# Patient Record
Sex: Male | Born: 1945 | Race: White | Hispanic: No | Marital: Married | Smoking: Former smoker
Health system: Southern US, Community
[De-identification: ages and names within clinical notes are randomized; demographics above are authoritative.]

## PROBLEM LIST (undated history)

## (undated) DIAGNOSIS — K259 Gastric ulcer, unspecified as acute or chronic, without hemorrhage or perforation: Secondary | ICD-10-CM

## (undated) DIAGNOSIS — E291 Testicular hypofunction: Secondary | ICD-10-CM

## (undated) DIAGNOSIS — R251 Tremor, unspecified: Secondary | ICD-10-CM

## (undated) DIAGNOSIS — Z9189 Other specified personal risk factors, not elsewhere classified: Secondary | ICD-10-CM

## (undated) DIAGNOSIS — N400 Enlarged prostate without lower urinary tract symptoms: Secondary | ICD-10-CM

## (undated) DIAGNOSIS — R062 Wheezing: Secondary | ICD-10-CM

## (undated) DIAGNOSIS — I10 Essential (primary) hypertension: Secondary | ICD-10-CM

## (undated) DIAGNOSIS — G4710 Hypersomnia, unspecified: Secondary | ICD-10-CM

## (undated) DIAGNOSIS — M76899 Other specified enthesopathies of unspecified lower limb, excluding foot: Secondary | ICD-10-CM

## (undated) DIAGNOSIS — M1990 Unspecified osteoarthritis, unspecified site: Secondary | ICD-10-CM

## (undated) DIAGNOSIS — E7800 Pure hypercholesterolemia, unspecified: Secondary | ICD-10-CM

## (undated) DIAGNOSIS — K219 Gastro-esophageal reflux disease without esophagitis: Secondary | ICD-10-CM

## (undated) DIAGNOSIS — R600 Localized edema: Secondary | ICD-10-CM

## (undated) DIAGNOSIS — B351 Tinea unguium: Secondary | ICD-10-CM

## (undated) DIAGNOSIS — D649 Anemia, unspecified: Secondary | ICD-10-CM

## (undated) DIAGNOSIS — G4730 Sleep apnea, unspecified: Secondary | ICD-10-CM

## (undated) DIAGNOSIS — Z860100 Personal history of colon polyps, unspecified: Secondary | ICD-10-CM

## (undated) DIAGNOSIS — J189 Pneumonia, unspecified organism: Secondary | ICD-10-CM

## (undated) DIAGNOSIS — Z872 Personal history of diseases of the skin and subcutaneous tissue: Secondary | ICD-10-CM

## (undated) DIAGNOSIS — R0600 Dyspnea, unspecified: Secondary | ICD-10-CM

## (undated) DIAGNOSIS — L409 Psoriasis, unspecified: Secondary | ICD-10-CM

## (undated) DIAGNOSIS — C801 Malignant (primary) neoplasm, unspecified: Secondary | ICD-10-CM

## (undated) DIAGNOSIS — M549 Dorsalgia, unspecified: Secondary | ICD-10-CM

## (undated) DIAGNOSIS — J45909 Unspecified asthma, uncomplicated: Secondary | ICD-10-CM

## (undated) DIAGNOSIS — Z889 Allergy status to unspecified drugs, medicaments and biological substances status: Secondary | ICD-10-CM

## (undated) HISTORY — PX: EYE SURGERY: SHX253

## (undated) HISTORY — PX: OTHER SURGICAL HISTORY: SHX169

## (undated) HISTORY — PX: TONSILLECTOMY: SUR1361

---

## 2008-04-09 ENCOUNTER — Ambulatory Visit: Payer: Self-pay | Admitting: Specialist

## 2008-04-11 ENCOUNTER — Ambulatory Visit: Payer: Self-pay | Admitting: Unknown Physician Specialty

## 2013-08-31 ENCOUNTER — Ambulatory Visit: Payer: Self-pay | Admitting: Unknown Physician Specialty

## 2013-12-15 ENCOUNTER — Inpatient Hospital Stay: Payer: Self-pay | Admitting: Internal Medicine

## 2013-12-15 LAB — COMPREHENSIVE METABOLIC PANEL
ALBUMIN: 3.3 g/dL — AB (ref 3.4–5.0)
Alkaline Phosphatase: 96 U/L
Anion Gap: 3 — ABNORMAL LOW (ref 7–16)
BUN: 21 mg/dL — ABNORMAL HIGH (ref 7–18)
Bilirubin,Total: 0.5 mg/dL (ref 0.2–1.0)
CHLORIDE: 104 mmol/L (ref 98–107)
CREATININE: 1.11 mg/dL (ref 0.60–1.30)
Calcium, Total: 9.1 mg/dL (ref 8.5–10.1)
Co2: 27 mmol/L (ref 21–32)
Glucose: 127 mg/dL — ABNORMAL HIGH (ref 65–99)
Osmolality: 273 (ref 275–301)
POTASSIUM: 4.4 mmol/L (ref 3.5–5.1)
SGOT(AST): 30 U/L (ref 15–37)
SGPT (ALT): 34 U/L (ref 12–78)
SODIUM: 134 mmol/L — AB (ref 136–145)
TOTAL PROTEIN: 8.1 g/dL (ref 6.4–8.2)

## 2013-12-15 LAB — CBC WITH DIFFERENTIAL/PLATELET
Basophil #: 0.1 10*3/uL (ref 0.0–0.1)
Basophil %: 0.8 %
EOS ABS: 0.2 10*3/uL (ref 0.0–0.7)
Eosinophil %: 2 %
HCT: 38.4 % — ABNORMAL LOW (ref 40.0–52.0)
HGB: 13.2 g/dL (ref 13.0–18.0)
LYMPHS PCT: 21 %
Lymphocyte #: 2.3 10*3/uL (ref 1.0–3.6)
MCH: 31.3 pg (ref 26.0–34.0)
MCHC: 34.3 g/dL (ref 32.0–36.0)
MCV: 91 fL (ref 80–100)
MONOS PCT: 8.1 %
Monocyte #: 0.9 x10 3/mm (ref 0.2–1.0)
Neutrophil #: 7.6 10*3/uL — ABNORMAL HIGH (ref 1.4–6.5)
Neutrophil %: 68.1 %
PLATELETS: 195 10*3/uL (ref 150–440)
RBC: 4.21 10*6/uL — ABNORMAL LOW (ref 4.40–5.90)
RDW: 14.6 % — ABNORMAL HIGH (ref 11.5–14.5)
WBC: 11.2 10*3/uL — AB (ref 3.8–10.6)

## 2013-12-15 LAB — HEMOGLOBIN A1C: Hemoglobin A1C: 6.1 % (ref 4.2–6.3)

## 2013-12-16 LAB — CBC WITH DIFFERENTIAL/PLATELET
Basophil #: 0.1 10*3/uL (ref 0.0–0.1)
Basophil %: 0.7 %
EOS ABS: 0.3 10*3/uL (ref 0.0–0.7)
EOS PCT: 3.3 %
HCT: 36.4 % — AB (ref 40.0–52.0)
HGB: 12.2 g/dL — AB (ref 13.0–18.0)
Lymphocyte #: 1.7 10*3/uL (ref 1.0–3.6)
Lymphocyte %: 21.8 %
MCH: 30.8 pg (ref 26.0–34.0)
MCHC: 33.6 g/dL (ref 32.0–36.0)
MCV: 92 fL (ref 80–100)
Monocyte #: 0.9 x10 3/mm (ref 0.2–1.0)
Monocyte %: 10.9 %
NEUTROS ABS: 5 10*3/uL (ref 1.4–6.5)
Neutrophil %: 63.3 %
PLATELETS: 194 10*3/uL (ref 150–440)
RBC: 3.98 10*6/uL — ABNORMAL LOW (ref 4.40–5.90)
RDW: 14.5 % (ref 11.5–14.5)
WBC: 8 10*3/uL (ref 3.8–10.6)

## 2013-12-16 LAB — BASIC METABOLIC PANEL
ANION GAP: 4 — AB (ref 7–16)
BUN: 19 mg/dL — AB (ref 7–18)
CO2: 27 mmol/L (ref 21–32)
CREATININE: 1.22 mg/dL (ref 0.60–1.30)
Calcium, Total: 8.6 mg/dL (ref 8.5–10.1)
Chloride: 105 mmol/L (ref 98–107)
EGFR (African American): >60
Glucose: 107 mg/dL — ABNORMAL HIGH (ref 65–99)
OSMOLALITY: 275 (ref 275–301)
Potassium: 4 mmol/L (ref 3.5–5.1)
Sodium: 136 mmol/L (ref 136–145)

## 2013-12-17 LAB — VANCOMYCIN, TROUGH: Vancomycin, Trough: 6 ug/mL — ABNORMAL LOW (ref 10–20)

## 2013-12-18 LAB — CREATININE, SERUM
Creatinine: 1.2 mg/dL (ref 0.60–1.30)
EGFR (African American): >60

## 2013-12-18 LAB — WBC: WBC: 7.4 10*3/uL (ref 3.8–10.6)

## 2013-12-20 LAB — CULTURE, BLOOD (SINGLE)

## 2014-04-30 ENCOUNTER — Ambulatory Visit: Payer: Self-pay | Admitting: Family Medicine

## 2014-07-13 ENCOUNTER — Ambulatory Visit: Payer: Self-pay | Admitting: Ophthalmology

## 2014-07-13 LAB — SEDIMENTATION RATE: ERYTHROCYTE SED RATE: 15 mm/h (ref 0–20)

## 2014-07-17 ENCOUNTER — Ambulatory Visit: Payer: Self-pay | Admitting: Ophthalmology

## 2014-07-20 ENCOUNTER — Ambulatory Visit: Payer: Self-pay | Admitting: Ophthalmology

## 2014-07-20 LAB — CREATININE, SERUM
Creatinine: 1.18 mg/dL (ref 0.60–1.30)
EGFR (African American): >60
EGFR (Non-African Amer.): >60

## 2015-02-16 NOTE — Discharge Summary (Signed)
PATIENT NAME:  Zachary Chase, Zachary Chase MR#:  [id number] DATE OF BIRTH:  1945/11/13  DATE OF ADMISSION:  12/15/2013 DATE OF DISCHARGE:  12/18/2013  DISCHARGE DIAGNOSES: 1.  Right hand cellulitis. 2.  Benign prostatic hyperplasia.  DISCHARGE MEDICATIONS: 1.  Amoxicillin 500 mg p.o. t.i.d. 2.  Doxycycline 100 mg p.o. b.i.d. 3.  Flomax 0.4 mg p.o. b.i.d. 4.  (finasteride  1 tablet p.o. daily.  The patient was given prescriptions for amoxicillin and also doxycycline for 10 days.   CONSULTATIONS: None.   HOSPITAL COURSE: The patient is a 69 year old male with history of BPH came in because of severe right hand swelling, erythema and tenderness. The patient woke up from sleep with swelling and redness and tenderness of the right hand, admitted for right hand cellulitis, did not have any history of trauma but right hand skin is very dry and has areas of skin irritation and likely open skin spots due to cracked skin with winter.  The patient's white count on admission was 11.2.  The patient received ultrasound of the upper extremity on the right which did not show any DVT.  He started he was started on IV Vanco and Zosyn. Blood cultures were negative. The patient's pain really improved and also redness and swelling improved with IV antibiotics alone, and the patient's white count also dropped to 8 and electrolytes did stay stable.  Blood cultures were negative. The patient's swelling was improved and he was able to move the fingers without pain and his redness also resolved. I advised him to keep the skin very moist to avoid cracks and also infection, and discharged home with amoxicillin and also doxycycline. The patient can follow up with his primary doctor in a week.   DISCHARGE VITAL SIGNS:  Heart rate 86, blood pressure 129/81 and sats are 95% on room air.  TIME SPENT ON DISCHARGE PREPARATION:  More than 30 minutes.  ____________________________ Epifanio Lesches, MD sk:ce D: 12/20/2013 11:53:04  ET T: 12/20/2013 12:29:16 ET JOB#: [id number]  cc: Epifanio Lesches, MD, <Dictator> Epifanio Lesches MD ELECTRONICALLY SIGNED 12/21/2013 14:21

## 2015-02-16 NOTE — H&P (Signed)
PATIENT NAME:  Zachary Chase, Zachary Chase MR#:  [id number] DATE OF BIRTH:  1946/07/09  DATE OF ADMISSION:  12/15/2013  PRIMARY CARE PROVIDER: Dr. Lovie Macadamia.  EMERGENCY DEPARTMENT REFERRING PHYSICIAN: Corinna Capra, MD  CHIEF COMPLAINT: Right hand swelling, erythema.   HISTORY OF PRESENT ILLNESS: The patient is a pleasant 69 year old white male with history of BPH, asthma, seasonal allergies, who reports that he was doing well up until about 2 days ago when he was sleeping, and when he woke up, his whole right hand was swollen and erythematous. It started in the wrist. Now the rest of his hand is swollen as well. He is not having much pain. He did have some pain in the wrist. He does not recall being bit by any insects or any trauma to that hand.   He otherwise denies any fever, but the hand, he reports, has being feeling hot. He denies any chest pain, shortness of breath, nausea, vomiting, diarrhea, or any urinary symptoms.   PAST MEDICAL HISTORY:  1.  History of BPH.  2.  History of asthma.  3.  History of seasonal allergies.   PAST SURGICAL HISTORY: HAD ) ALLERGY FOR SLEEP APNEA.   ALLERGIES: SULFA.   CURRENT MEDICATIONS: He is on Flomax 0.4, one tab p.o. daily; finasteride, he is not sure of the dose.   SOCIAL HISTORY: History of smoking, quit. Drinks 1 or 2 glasses of wine on a daily basis. No drug use.   FAMILY HISTORY: Positive for hypertension.   REVIEW OF SYSTEMS:  CONSTITUTIONAL: Denies any fevers, fatigue, weakness, some pain in the hand. No weight loss. No weight gain.  EYES: No blurred or double vision. No pain. No redness. No inflammation. No glaucoma. No cataracts.  ENT: No tinnitus. No ear pain. No hearing loss. No seasonal or year-round allergies. No epistaxis. No nasal discharge. No difficulty swallowing.  RESPIRATORY: Denies any cough, wheezing, hemoptysis. No dyspnea.  CARDIOVASCULAR: Denies any chest pain, orthopnea, edema.  GASTROINTESTINAL: No nausea, vomiting, diarrhea.  No abdominal pain. No hematemesis. No melena. No ulcer.  GENITOURINARY: Denies any dysuria, hematuria, renal calculus or frequency.  ENDOCRINE: Denies any polyuria, nocturia or thyroid problems.  HEMOLYMPHATIC: Denies any major bruisability or bleeding.  SKIN: No acne. No rash. No changes in mole, hair or skin.  MUSCULOSKELETAL: Complains of some pain in the right hand.  NEUROLOGIC: No numbness, CVA, TIA or seizures.  PSYCHIATRIC: No anxiety. No insomnia.   PHYSICAL EXAMINATION:  VITAL SIGNS: Temperature 98.3, pulse 93, respirations 18, blood pressure 139/78, O2 94%.  GENERAL: A well-developed male in no acute distress.  HEENT: Head atraumatic, normocephalic. Pupils equally round, reactive to light and accommodation. There is no conjunctival pallor. No scleral icterus. Nasal exam shows no drainage or ulceration.  OROPHARYNX: Clear without any exudate.  NECK: Supple without any JVD.  CARDIOVASCULAR: Regular rate and rhythm. No murmurs, rubs, clicks, or gallops. PMI is not displaced.  LUNGS: Clear to auscultation bilaterally without any rales, rhonchi, wheezing.  ABDOMEN: Soft, nontender, nondistended. Positive bowel sounds x4.  EXTREMITIES: Right hand swelling. There is erythema on the wrist as well as the hand. He is able to flex, extend, and bend his fingers without any difficulties. There is erythema and warmth extending up midway to his hand.  NEUROLOGIC: Awake, alert, oriented x3. No focal deficits.  PSYCHIATRIC: Not anxious or depressed.  LYMPHATICS: No lymph nodes palpable.   LABORATORY DATA: Glucose 127, BUN 21, creatinine 1.11, sodium 134, potassium 4.4, chloride 104. CO2 is 27. Calcium  is 9.1. LFTs: Total protein 8.1, albumin 3.3, bilirubin total 0.5, alk phos 96, AST 30. ALT is 34. WBC 11.2, hemoglobin 13.2, platelet count is 195.   Ultrasound Doppler of the upper extremity negative for DVT.   ASSESSMENT AND PLAN: The patient is a pleasant 69 year old white male who presents with  right hand swelling and erythema.  1.  Severe right-hand cellulitis. At this time, I will treat him with IV vancomycin and Zosyn due to the severity of the infection. We will also obtain blood cultures. Follow for any worsening symptoms. At this point, he is stable to bend his fingers. There is no evidence of compartment syndrome, but if he starts having significant pain or worsening swelling, then we will have to obtain CT or MRI to rule these out.  2.  Benign prostatic hypertrophy. We will continue Flomax and finasteride once the finasteride dose is verified.  3.  History of asthma, stable. Currently not on any treatment.  4.  MISCELLANEOUS: We will place him on heparin for deep vein thrombosis prophylaxis.    NOTE: Time spent 50 minutes.    ____________________________ Lafonda Mosses Posey Pronto, MD shp:np D: 12/15/2013 20:39:19 ET T: 12/15/2013 20:55:11 ET JOB#: [id number]  cc: Amilah Greenspan H. Posey Pronto, MD, <Dictator> Alric Seton MD ELECTRONICALLY SIGNED 12/29/2013 17:57 Alric Seton MD ELECTRONICALLY SIGNED 12/29/2013 17:57

## 2016-08-27 ENCOUNTER — Encounter: Payer: Self-pay | Admitting: *Deleted

## 2016-09-01 ENCOUNTER — Ambulatory Visit: Payer: Medicare Other | Admitting: Anesthesiology

## 2016-09-01 ENCOUNTER — Encounter
Admission: RE | Disposition: A | Discharge status: Home / Self Care | Payer: Self-pay | Source: Ambulatory Visit | Attending: Ophthalmology

## 2016-09-01 ENCOUNTER — Ambulatory Visit
Admission: RE | Admit: 2016-09-01 | Discharge: 2016-09-01 | Disposition: A | Discharge status: Home / Self Care | Payer: Medicare Other | Source: Ambulatory Visit | Attending: Ophthalmology | Admitting: Ophthalmology

## 2016-09-01 DIAGNOSIS — Z87891 Personal history of nicotine dependence: Secondary | ICD-10-CM | POA: Diagnosis not present

## 2016-09-01 DIAGNOSIS — N400 Enlarged prostate without lower urinary tract symptoms: Secondary | ICD-10-CM | POA: Insufficient documentation

## 2016-09-01 DIAGNOSIS — Z79899 Other long term (current) drug therapy: Secondary | ICD-10-CM | POA: Insufficient documentation

## 2016-09-01 DIAGNOSIS — G8929 Other chronic pain: Secondary | ICD-10-CM | POA: Insufficient documentation

## 2016-09-01 DIAGNOSIS — H2511 Age-related nuclear cataract, right eye: Secondary | ICD-10-CM | POA: Diagnosis not present

## 2016-09-01 DIAGNOSIS — Z791 Long term (current) use of non-steroidal anti-inflammatories (NSAID): Secondary | ICD-10-CM | POA: Insufficient documentation

## 2016-09-01 DIAGNOSIS — I10 Essential (primary) hypertension: Secondary | ICD-10-CM | POA: Diagnosis not present

## 2016-09-01 DIAGNOSIS — Z9889 Other specified postprocedural states: Secondary | ICD-10-CM | POA: Diagnosis not present

## 2016-09-01 DIAGNOSIS — R600 Localized edema: Secondary | ICD-10-CM | POA: Insufficient documentation

## 2016-09-01 DIAGNOSIS — M1990 Unspecified osteoarthritis, unspecified site: Secondary | ICD-10-CM | POA: Diagnosis not present

## 2016-09-01 DIAGNOSIS — E669 Obesity, unspecified: Secondary | ICD-10-CM | POA: Insufficient documentation

## 2016-09-01 DIAGNOSIS — M542 Cervicalgia: Secondary | ICD-10-CM | POA: Diagnosis not present

## 2016-09-01 DIAGNOSIS — J45909 Unspecified asthma, uncomplicated: Secondary | ICD-10-CM | POA: Insufficient documentation

## 2016-09-01 DIAGNOSIS — G4730 Sleep apnea, unspecified: Secondary | ICD-10-CM | POA: Insufficient documentation

## 2016-09-01 DIAGNOSIS — Z7951 Long term (current) use of inhaled steroids: Secondary | ICD-10-CM | POA: Diagnosis not present

## 2016-09-01 DIAGNOSIS — Z6828 Body mass index (BMI) 28.0-28.9, adult: Secondary | ICD-10-CM | POA: Insufficient documentation

## 2016-09-01 DIAGNOSIS — Z8711 Personal history of peptic ulcer disease: Secondary | ICD-10-CM | POA: Diagnosis not present

## 2016-09-01 HISTORY — DX: Gastric ulcer, unspecified as acute or chronic, without hemorrhage or perforation: K25.9

## 2016-09-01 HISTORY — DX: Tremor, unspecified: R25.1

## 2016-09-01 HISTORY — DX: Essential (primary) hypertension: I10

## 2016-09-01 HISTORY — DX: Dorsalgia, unspecified: M54.9

## 2016-09-01 HISTORY — DX: Unspecified asthma, uncomplicated: J45.909

## 2016-09-01 HISTORY — PX: CATARACT EXTRACTION W/PHACO: SHX586

## 2016-09-01 HISTORY — DX: Other specified personal risk factors, not elsewhere classified: Z91.89

## 2016-09-01 HISTORY — DX: Wheezing: R06.2

## 2016-09-01 HISTORY — DX: Allergy status to unspecified drugs, medicaments and biological substances: Z88.9

## 2016-09-01 HISTORY — DX: Pneumonia, unspecified organism: J18.9

## 2016-09-01 HISTORY — DX: Localized edema: R60.0

## 2016-09-01 HISTORY — DX: Benign prostatic hyperplasia without lower urinary tract symptoms: N40.0

## 2016-09-01 SURGERY — PHACOEMULSIFICATION, CATARACT, WITH IOL INSERTION
Anesthesia: Monitor Anesthesia Care | Site: Eye | Laterality: Right | Wound class: Clean

## 2016-09-01 MED ORDER — MOXIFLOXACIN HCL 0.5 % OP SOLN
OPHTHALMIC | Status: DC | PRN
  Administered 2016-09-01: 9 [drp] via OPHTHALMIC

## 2016-09-01 MED ORDER — NA CHONDROIT SULF-NA HYALURON 40-17 MG/ML IO SOLN
INTRAOCULAR | Status: DC | PRN
  Administered 2016-09-01: 1 mL via INTRAOCULAR

## 2016-09-01 MED ORDER — ARMC OPHTHALMIC DILATING DROPS
OPHTHALMIC | Status: AC
  Administered 2016-09-01: 1 via OPHTHALMIC
  Filled 2016-09-01: qty 0.4

## 2016-09-01 MED ORDER — SODIUM CHLORIDE 0.9 % IV SOLN
INTRAVENOUS | Status: DC
  Administered 2016-09-01: 10:00:00 via INTRAVENOUS

## 2016-09-01 MED ORDER — EPINEPHRINE PF 1 MG/ML IJ SOLN
INTRAOCULAR | Status: DC | PRN
  Administered 2016-09-01: 200 mL via OPHTHALMIC

## 2016-09-01 MED ORDER — ARMC OPHTHALMIC DILATING DROPS
1.0000 "application " | OPHTHALMIC | Status: AC
  Administered 2016-09-01 (×3): 1 via OPHTHALMIC

## 2016-09-01 MED ORDER — CARBACHOL 0.01 % IO SOLN
INTRAOCULAR | Status: DC | PRN
  Administered 2016-09-01: 0.5 mL via INTRAOCULAR

## 2016-09-01 MED ORDER — ARMC OPHTHALMIC DILATING DROPS: 1.0000 "application " | OPHTHALMIC | Status: AC

## 2016-09-01 MED ORDER — MIDAZOLAM HCL 2 MG/2ML IJ SOLN
INTRAMUSCULAR | Status: DC | PRN
  Administered 2016-09-01: 1 mg via INTRAVENOUS

## 2016-09-01 MED ORDER — EPINEPHRINE PF 1 MG/ML IJ SOLN
INTRAMUSCULAR | Status: AC
  Filled 2016-09-01: qty 2

## 2016-09-01 MED ORDER — POVIDONE-IODINE 5 % OP SOLN
OPHTHALMIC | Status: AC
  Filled 2016-09-01: qty 30

## 2016-09-01 MED ORDER — MOXIFLOXACIN HCL 0.5 % OP SOLN
1.0000 [drp] | OPHTHALMIC | Status: AC
  Administered 2016-09-01 (×3): 1 [drp] via OPHTHALMIC

## 2016-09-01 MED ORDER — LIDOCAINE HCL (PF) 4 % IJ SOLN
INTRAMUSCULAR | Status: AC
  Filled 2016-09-01: qty 5

## 2016-09-01 MED ORDER — NA CHONDROIT SULF-NA HYALURON 40-17 MG/ML IO SOLN
INTRAOCULAR | Status: AC
Start: 2016-09-01 — End: 2016-09-01
  Filled 2016-09-01: qty 1

## 2016-09-01 MED ORDER — MOXIFLOXACIN HCL 0.5 % OP SOLN: 1.0000 [drp] | OPHTHALMIC | Status: AC

## 2016-09-01 MED ORDER — MOXIFLOXACIN HCL 0.5 % OP SOLN
OPHTHALMIC | Status: AC
  Administered 2016-09-01: 1 [drp] via OPHTHALMIC
  Filled 2016-09-01: qty 3

## 2016-09-01 MED ORDER — FENTANYL CITRATE (PF) 100 MCG/2ML IJ SOLN
INTRAMUSCULAR | Status: DC | PRN
  Administered 2016-09-01: 50 ug via INTRAVENOUS

## 2016-09-01 MED ORDER — LIDOCAINE HCL (PF) 4 % IJ SOLN
INTRAOCULAR | Status: DC | PRN
  Administered 2016-09-01: 4 mL via OPHTHALMIC

## 2016-09-01 SURGICAL SUPPLY — 21 items
CANNULA ANT/CHMB 27GA (MISCELLANEOUS) ×3 IMPLANT
CUP MEDICINE 2OZ PLAST GRAD ST (MISCELLANEOUS) ×3 IMPLANT
GLOVE BIO SURGEON STRL SZ8 (GLOVE) ×3 IMPLANT
GLOVE BIOGEL M 6.5 STRL (GLOVE) ×3 IMPLANT
GLOVE SURG LX 8.0 MICRO (GLOVE) ×2
GLOVE SURG LX STRL 8.0 MICRO (GLOVE) ×1 IMPLANT
GOWN STRL REUS W/ TWL LRG LVL3 (GOWN DISPOSABLE) ×2 IMPLANT
GOWN STRL REUS W/TWL LRG LVL3 (GOWN DISPOSABLE) ×4
LENS IOL TECNIS ITEC 21.0 (Intraocular Lens) ×3 IMPLANT
PACK CATARACT (MISCELLANEOUS) ×3 IMPLANT
PACK CATARACT BRASINGTON LX (MISCELLANEOUS) ×3 IMPLANT
PACK EYE AFTER SURG (MISCELLANEOUS) ×3 IMPLANT
SOL BSS BAG (MISCELLANEOUS) ×3
SOL PREP PVP 2OZ (MISCELLANEOUS) ×3
SOLUTION BSS BAG (MISCELLANEOUS) ×1 IMPLANT
SOLUTION PREP PVP 2OZ (MISCELLANEOUS) ×1 IMPLANT
SYR 3ML LL SCALE MARK (SYRINGE) ×3 IMPLANT
SYR 5ML LL (SYRINGE) ×3 IMPLANT
SYR TB 1ML 27GX1/2 LL (SYRINGE) ×3 IMPLANT
WATER STERILE IRR 250ML POUR (IV SOLUTION) ×3 IMPLANT
WIPE NON LINTING 3.25X3.25 (MISCELLANEOUS) ×3 IMPLANT

## 2016-09-01 NOTE — Transfer of Care (Signed)
Immediate Anesthesia Transfer of Care Note  Patient: Zachary Chase  Procedure(s) Performed: Procedure(s) with comments: CATARACT EXTRACTION PHACO AND INTRAOCULAR LENS PLACEMENT (IOC) (Right) - Lot# [id number] H [place]: 00:34.0 AP%:21.8 CDE: 7.39   Patient Location: PACU  Anesthesia Type:MAC  Level of Consciousness: awake, alert  and oriented  Airway & Oxygen Therapy: Patient Spontanous Breathing  Post-op Assessment: Report given to RN and Post -op Vital signs reviewed and stable  Post vital signs: Reviewed and stable  Last Vitals:  Vitals:   09/01/16 1135 09/01/16 1139  BP: 120/62 120/62  Pulse: 73 73  Resp: 16 16  Temp: 36.3 C     Last Pain:  Vitals:   09/01/16 1139  TempSrc: Oral         Complications: No apparent anesthesia complications

## 2016-09-01 NOTE — Anesthesia Preprocedure Evaluation (Signed)
Anesthesia Evaluation  Patient identified by MRN, date of birth, ID band Patient awake    Reviewed: Allergy & Precautions, NPO status , Patient's Chart, lab work & pertinent test results  Airway Mallampati: II  TM Distance: >3 FB Neck ROM: Full    Dental no notable dental hx.    Pulmonary asthma (daily controller, well controlled) , former smoker,    breath sounds clear to auscultation- rhonchi (-) wheezing      Cardiovascular Exercise Tolerance: Good hypertension, Pt. on medications (-) CAD, (-) Past MI and (-) Cardiac Stents  Rhythm:Regular Rate:Normal - Systolic murmurs and - Diastolic murmurs    Neuro/Psych negative neurological ROS  negative psych ROS   GI/Hepatic Neg liver ROS, PUD,   Endo/Other  negative endocrine ROSneg diabetes  Renal/GU negative Renal ROS     Musculoskeletal negative musculoskeletal ROS (+)   Abdominal (+) + obese,   Peds  Hematology negative hematology ROS (+)   Anesthesia Other Findings Past Medical History: No date: Asthma No date: Back pain No date: BPH (benign prostatic hyperplasia) No date: Gastric ulcer No date: H/O multiple allergies No date: Hypertension No date: Lower extremity edema No date: Pneumonia No date: Tremor     Comment: of the head No date: Wheezing   Reproductive/Obstetrics                             Anesthesia Physical Anesthesia Plan  ASA: II  Anesthesia Plan: MAC   Post-op Pain Management:    Induction: Intravenous  Airway Management Planned: Natural Airway  Additional Equipment:   Intra-op Plan:   Post-operative Plan:   Informed Consent: I have reviewed the patients History and Physical, chart, labs and discussed the procedure including the risks, benefits and alternatives for the proposed anesthesia with the patient or authorized representative who has indicated his/her understanding and acceptance.     Plan  Discussed with: Anesthesiologist and CRNA  Anesthesia Plan Comments:         Anesthesia Quick Evaluation

## 2016-09-01 NOTE — Anesthesia Postprocedure Evaluation (Signed)
Anesthesia Post Note  Patient: Zachary Chase  Procedure(s) Performed: Procedure(s) (LRB): CATARACT EXTRACTION PHACO AND INTRAOCULAR LENS PLACEMENT (IOC) (Right)  Patient location during evaluation: PACU Anesthesia Type: MAC Level of consciousness: awake, awake and alert and oriented Pain management: pain level controlled Vital Signs Assessment: post-procedure vital signs reviewed and stable Respiratory status: spontaneous breathing, nonlabored ventilation and respiratory function stable Cardiovascular status: blood pressure returned to baseline and stable Postop Assessment: no signs of nausea or vomiting Anesthetic complications: no    Last Vitals:  Vitals:   09/01/16 1135 09/01/16 1139  BP: 120/62 120/62  Pulse: 73 73  Resp: 16 16  Temp: 36.3 C     Last Pain:  Vitals:   09/01/16 1139  TempSrc: Oral                 Darlyne Russian

## 2016-09-01 NOTE — Op Note (Signed)
PREOPERATIVE DIAGNOSIS:  Nuclear sclerotic cataract of the right eye.   POSTOPERATIVE DIAGNOSIS:  right nuclear sclerotic cataract   OPERATIVE PROCEDURE: Procedure(s): CATARACT EXTRACTION PHACO AND INTRAOCULAR LENS PLACEMENT (IOC)   SURGEON:  Birder Robson, MD.   ANESTHESIA:  Anesthesiologist: Emmie Niemann, MD CRNA: Darlyne Russian, CRNA  1.      Managed anesthesia care. 2.      0.50ml of Shugarcaine was instilled in the eye following the paracentesis.   COMPLICATIONS:  None.   TECHNIQUE:   Stop and chop   DESCRIPTION OF PROCEDURE:  The patient was examined and consented in the preoperative holding area where the aforementioned topical anesthesia was applied to the right eye and then brought back to the Operating Room where the right eye was prepped and draped in the usual sterile ophthalmic fashion and a lid speculum was placed. A paracentesis was created with the side port blade and the anterior chamber was filled with viscoelastic. A near clear corneal incision was performed with the steel keratome. A continuous curvilinear capsulorrhexis was performed with a cystotome followed by the capsulorrhexis forceps. Hydrodissection and hydrodelineation were carried out with BSS on a blunt cannula. The lens was removed in a stop and chop  technique and the remaining cortical material was removed with the irrigation-aspiration handpiece. The capsular bag was inflated with viscoelastic and the Technis [id number]  lens was placed in the capsular bag without complication. The remaining viscoelastic was removed from the eye with the irrigation-aspiration handpiece. The wounds were hydrated. The anterior chamber was flushed with Miostat and the eye was inflated to physiologic pressure. 0.7ml of Vigamox was placed in the anterior chamber. The wounds were found to be water tight. The eye was dressed with Vigamox. The patient was given protective glasses to wear throughout the day and a shield with which to sleep  tonight. The patient was also given drops with which to begin a drop regimen today and will follow-up with me in one day.  Implant Name Type Inv. Item Serial No. Manufacturer Lot No. LRB No. Used  LENS IOL DIOP 21.0 - [id number] Intraocular Lens LENS IOL DIOP 21.0 [id number]   Right 1   Procedure(s) with comments: CATARACT EXTRACTION PHACO AND INTRAOCULAR LENS PLACEMENT (IOC) (Right) - Lot# [id number] H [place]: 00:34.0 AP%:21.8 CDE: 7.39   Electronically signed: [place] 09/01/2016 11:37 AM

## 2016-09-01 NOTE — H&P (Signed)
All labs reviewed. Abnormal studies sent to patients PCP when indicated.  Previous H&P reviewed, patient examined, there are NO CHANGES.  Zachary Chase LOUIS11/7/201711:14 AM

## 2016-09-02 ENCOUNTER — Encounter: Payer: Self-pay | Admitting: Ophthalmology

## 2017-12-06 ENCOUNTER — Emergency Department: Payer: Medicare Other

## 2017-12-06 ENCOUNTER — Emergency Department
Admission: EM | Admit: 2017-12-06 | Discharge: 2017-12-06 | Disposition: A | Discharge status: Home / Self Care | Payer: Medicare Other | Attending: Emergency Medicine | Admitting: Emergency Medicine

## 2017-12-06 ENCOUNTER — Encounter: Payer: Self-pay | Admitting: Emergency Medicine

## 2017-12-06 ENCOUNTER — Other Ambulatory Visit: Payer: Self-pay

## 2017-12-06 DIAGNOSIS — J45909 Unspecified asthma, uncomplicated: Secondary | ICD-10-CM | POA: Insufficient documentation

## 2017-12-06 DIAGNOSIS — Z87891 Personal history of nicotine dependence: Secondary | ICD-10-CM | POA: Insufficient documentation

## 2017-12-06 DIAGNOSIS — Z79899 Other long term (current) drug therapy: Secondary | ICD-10-CM | POA: Insufficient documentation

## 2017-12-06 DIAGNOSIS — I10 Essential (primary) hypertension: Secondary | ICD-10-CM | POA: Diagnosis not present

## 2017-12-06 DIAGNOSIS — N3000 Acute cystitis without hematuria: Secondary | ICD-10-CM

## 2017-12-06 DIAGNOSIS — R1030 Lower abdominal pain, unspecified: Secondary | ICD-10-CM | POA: Diagnosis present

## 2017-12-06 LAB — URINALYSIS, COMPLETE (UACMP) WITH MICROSCOPIC
Bilirubin Urine: NEGATIVE
GLUCOSE, UA: NEGATIVE mg/dL
Ketones, ur: NEGATIVE mg/dL
NITRITE: NEGATIVE
PROTEIN: 30 mg/dL — AB
SPECIFIC GRAVITY, URINE: 1.016 (ref 1.005–1.030)
pH: 5 (ref 5.0–8.0)

## 2017-12-06 LAB — COMPREHENSIVE METABOLIC PANEL
ALBUMIN: 4.2 g/dL (ref 3.5–5.0)
ALK PHOS: 87 U/L (ref 38–126)
ALT: 28 U/L (ref 17–63)
AST: 25 U/L (ref 15–41)
Anion gap: 15 (ref 5–15)
BILIRUBIN TOTAL: 0.7 mg/dL (ref 0.3–1.2)
BUN: 21 mg/dL — AB (ref 6–20)
CALCIUM: 9.2 mg/dL (ref 8.9–10.3)
CO2: 20 mmol/L — AB (ref 22–32)
Chloride: 100 mmol/L — ABNORMAL LOW (ref 101–111)
Creatinine, Ser: 1.15 mg/dL (ref 0.61–1.24)
GFR calc Af Amer: >60 mL/min (ref >60)
GFR calc non Af Amer: >60 mL/min (ref >60)
GLUCOSE: 168 mg/dL — AB (ref 65–99)
Potassium: 4.2 mmol/L (ref 3.5–5.1)
Sodium: 135 mmol/L (ref 135–145)
TOTAL PROTEIN: 8.5 g/dL — AB (ref 6.5–8.1)

## 2017-12-06 LAB — LIPASE, BLOOD: Lipase: 29 U/L (ref 11–51)

## 2017-12-06 LAB — CBC
HEMATOCRIT: 41.7 % (ref 40.0–52.0)
Hemoglobin: 14.1 g/dL (ref 13.0–18.0)
MCH: 30.5 pg (ref 26.0–34.0)
MCHC: 33.9 g/dL (ref 32.0–36.0)
MCV: 90.1 fL (ref 80.0–100.0)
Platelets: 201 10*3/uL (ref 150–440)
RBC: 4.63 MIL/uL (ref 4.40–5.90)
RDW: 13.9 % (ref 11.5–14.5)
WBC: 9.2 10*3/uL (ref 3.8–10.6)

## 2017-12-06 MED ORDER — SODIUM CHLORIDE 0.9 % IV SOLN
1.0000 g | Freq: Once | INTRAVENOUS | Status: AC
  Administered 2017-12-06: 1 g via INTRAVENOUS
  Filled 2017-12-06: qty 10

## 2017-12-06 MED ORDER — FLUCONAZOLE 50 MG PO TABS
150.0000 mg | ORAL_TABLET | Freq: Once | ORAL | Status: AC
  Administered 2017-12-06: 150 mg via ORAL
  Filled 2017-12-06: qty 1

## 2017-12-06 MED ORDER — CEPHALEXIN 500 MG PO CAPS: 500.0000 mg | ORAL_CAPSULE | Freq: Two times a day (BID) | ORAL | 0 refills | Status: AC

## 2017-12-06 MED ORDER — HYDROCODONE-ACETAMINOPHEN 5-325 MG PO TABS: 1.0000 | ORAL_TABLET | Freq: Four times a day (QID) | ORAL | 0 refills | Status: DC | PRN

## 2017-12-06 MED ORDER — SODIUM CHLORIDE 0.9 % IV BOLUS (SEPSIS)
500.0000 mL | Freq: Once | INTRAVENOUS | Status: AC
  Administered 2017-12-06: 500 mL via INTRAVENOUS

## 2017-12-06 MED ORDER — ONDANSETRON HCL 4 MG/2ML IJ SOLN
4.0000 mg | Freq: Once | INTRAMUSCULAR | Status: AC
  Administered 2017-12-06: 4 mg via INTRAVENOUS
  Filled 2017-12-06: qty 2

## 2017-12-06 MED ORDER — IOPAMIDOL (ISOVUE-300) INJECTION 61%
100.0000 mL | Freq: Once | INTRAVENOUS | Status: AC | PRN
  Administered 2017-12-06: 100 mL via INTRAVENOUS

## 2017-12-06 MED ORDER — MORPHINE SULFATE (PF) 4 MG/ML IV SOLN
4.0000 mg | Freq: Once | INTRAVENOUS | Status: AC
  Administered 2017-12-06: 4 mg via INTRAVENOUS
  Filled 2017-12-06: qty 1

## 2017-12-06 NOTE — Discharge Instructions (Signed)
Take the antibiotic as prescribed and finish the full course.  Follow-up with your doctor within the next week.  Return to the emergency department for new, worsening, recurrent pain, pain not relieved by the medications, weakness or lightheadedness, high fevers, vomiting, inability to tolerate the medications, or any other new or worsening symptoms that concern you.

## 2017-12-06 NOTE — ED Provider Notes (Addendum)
[facility] Emergency Department Provider Note ____________________________________________   First MD Initiated Contact with Patient 12/06/17 2040     (approximate)  I have reviewed the triage vital signs and the nursing notes.   HISTORY  Chief Complaint Abdominal Pain    HPI Zachary Chase is a 72 y.o. male with past medical history as noted below who presents with suprapubic abdominal pain for approximately 8 hours, gradual onset, constant course, and nonradiating.  Patient reports nausea but no vomiting, but denies diarrhea, fever, or acute urinary symptoms.  Patient states he has had bladder infections before, but denies any prior pain this severe.  Past Medical History:  Diagnosis Date  . Asthma   . Back pain   . BPH (benign prostatic hyperplasia)   . Gastric ulcer   . H/O multiple allergies   . Hypertension   . Lower extremity edema   . Pneumonia   . Tremor    of the head  . Wheezing     There are no active problems to display for this patient.   Past Surgical History:  Procedure Laterality Date  . CATARACT EXTRACTION W/PHACO Right 09/01/2016   Procedure: CATARACT EXTRACTION PHACO AND INTRAOCULAR LENS PLACEMENT (IOC);  Surgeon: Birder Robson, MD;  Location: [facility] ORS;  Service: Ophthalmology;  Laterality: Right;  Lot# [id number] H [place]: 00:34.0 AP%:21.8 CDE: 7.39   . sleep apnea surgery    . TONSILLECTOMY      Prior to Admission medications   Medication Sig Start Date End Date Taking? Authorizing Provider  albuterol (PROVENTIL HFA;VENTOLIN HFA) 108 (90 Base) MCG/ACT inhaler Inhale 1 puff into the lungs every 4 (four) hours as needed for wheezing or shortness of breath.    [provider]  augmented betamethasone dipropionate (DIPROLENE-AF) 0.05 % ointment Apply 1 application topically 2 (two) times daily. 08/23/16   [provider]  betamethasone dipropionate (DIPROLENE) 0.05 % cream Apply 1 Dose topically 2  (two) times daily.    [provider]  budesonide-formoterol (SYMBICORT) 80-4.5 MCG/ACT inhaler Inhale 2 puffs into the lungs 2 (two) times daily.    [provider]  celecoxib (CELEBREX) 200 MG capsule Take 200 mg by mouth daily.    [provider]  docusate sodium (COLACE) 100 MG capsule Take 200 mg by mouth at bedtime.    [provider]  fexofenadine (ALLEGRA) 180 MG tablet Take 180 mg by mouth daily.    [provider]  finasteride (PROSCAR) 5 MG tablet Take 5 mg by mouth daily.    [provider]  fluticasone (FLONASE) 50 MCG/ACT nasal spray Place 1 spray into both nostrils daily as needed for allergies or rhinitis.    [provider]  ketotifen (ALAWAY) 0.025 % ophthalmic solution Place 1 drop into both eyes 2 (two) times daily as needed.    [provider]  lisinopril-hydrochlorothiazide (PRINZIDE,ZESTORETIC) 20-12.5 MG tablet Take 0.5 tablets by mouth daily.    [provider]  Melatonin 10 MG TABS Take 1 tablet by mouth at bedtime.    [provider]  Multiple Vitamins-Minerals (HAIR SKIN AND NAILS FORMULA) TABS Take 1 tablet by mouth daily.    [provider]  tamsulosin (FLOMAX) 0.4 MG CAPS capsule Take 0.4 mg by mouth 2 (two) times daily.    [provider]    Allergies Erythromycin  No family history on file.  Social History Social History   Tobacco Use  . Smoking status: Former Research scientist (life sciences)  . Smokeless tobacco:  Never Used  Substance Use Topics  . Alcohol use: Yes    Comment: occasional  . Drug use: Not on file    Review of Systems  Constitutional: No fever. Eyes: No redness. ENT: No neck pain. Cardiovascular: Denies chest pain. Respiratory: Denies shortness of breath. Gastrointestinal: Positive for nausea, no vomiting.  Genitourinary: Negative for dysuria or hematuria.  Musculoskeletal: Negative for back pain. Skin: Negative for rash. Neurological: Negative  for headache.   ____________________________________________   PHYSICAL EXAM:  VITAL SIGNS: ED Triage Vitals  Enc Vitals Group     BP 12/06/17 1852 (!) 178/80     Pulse Rate 12/06/17 1852 85     Resp 12/06/17 1852 20     Temp 12/06/17 1852 98.6 F (37 C)     Temp Source 12/06/17 1852 Oral     SpO2 12/06/17 1852 100 %     Weight 12/06/17 1854 250 lb (113.4 kg)     Height 12/06/17 1854 5\' 3"  (1.6 m)     Head Circumference --      Peak Flow --      Pain Score 12/06/17 1854 6     Pain Loc --      Pain Edu? --      Excl. in [place]? --     Constitutional: Alert and oriented.  Slightly uncomfortable but not acutely ill appearing.   Eyes: Conjunctivae are normal.  No scleral icterus Head: Atraumatic. Nose: No congestion/rhinnorhea. Mouth/Throat: Mucous membranes are moist.   Neck: Normal range of motion.  Cardiovascular:  Good peripheral circulation. Respiratory: Normal respiratory effort.  No retractions.  Gastrointestinal: Soft with mild suprapubic tenderness..  Genitourinary: No flank tenderness. Musculoskeletal: No lower extremity edema.  Extremities warm and well perfused.  Neurologic:  Normal speech and language. No gross focal neurologic deficits are appreciated.  Skin:  Skin is warm and dry. No rash noted. Psychiatric: Mood and affect are normal. Speech and behavior are normal.  ____________________________________________   LABS (all labs ordered are listed, but only abnormal results are displayed)  Labs Reviewed  COMPREHENSIVE METABOLIC PANEL - Abnormal; Notable for the following components:      Result Value   Chloride 100 (*)    CO2 20 (*)    Glucose, Bld 168 (*)    BUN 21 (*)    Total Protein 8.5 (*)    All other components within normal limits  URINALYSIS, COMPLETE (UACMP) WITH MICROSCOPIC - Abnormal; Notable for the following components:   Color, Urine YELLOW (*)    APPearance CLOUDY (*)    Hgb urine dipstick MODERATE (*)    Protein, ur 30 (*)     Leukocytes, UA LARGE (*)    Bacteria, UA RARE (*)    Squamous Epithelial / LPF 0-5 (*)    All other components within normal limits  LIPASE, BLOOD  CBC   ____________________________________________  EKG   ____________________________________________  RADIOLOGY  CT abdomen: Distention of urinary bladder, possible mild fat stranding adjacent pancreas, no diverticulitis colitis  ____________________________________________   PROCEDURES  Procedure(s) performed: No  Procedures  Critical Care performed: No ____________________________________________   INITIAL IMPRESSION / ASSESSMENT AND PLAN / ED COURSE  Pertinent labs & imaging results that were available during my care of the patient were reviewed by me and considered in my medical decision making (see chart for details).  72 year old male with past medical history as noted above including BPH and prior urinary tract infections presents with suprapubic abdominal pain associated with nausea over  the course of the day today, with no fever, vomiting, diarrhea or other associated symptoms.  Patient states it is more severe pain than he has had with prior bladder infections.  Past medical records reviewed in Epic and are noncontributory.  On exam, the patient is relatively well appearing, he is hypertensive (which I suspect is likely due to acute pain) but other vital signs are normal, and he has some suprapubic mild tenderness.  UA is consistent with UTI.  Overall presentation is most consistent with UTI/cystitis, but however the level of pain somewhat out of proportion to a bladder infection, and the patient's age, I will obtain a CT to rule out colitis, diverticulitis, or other acute intra-abdominal cause.  Plan for basic labs as well.  If workup is otherwise negative and patient's symptoms are controlled, anticipate discharge home.  Clinical Course as of Dec 06 2300  Mon Dec 06, 2017  2209 [place] Yeast: PRESENT [SS]  2258  MCHC: 33.9 [SS]    Clinical Course User Index [SS] Arta Silence, MD    ----------------------------------------- 10:59 PM on 12/06/2017 -----------------------------------------  CT is negative for concerning acute findings other than distention of the bladder.  There is noted to be signs of inflammation in the mesenteric fat adjacent to the pancreas, but this does not correlate clinically with the patient's presentation.  He has no upper abdominal pain or tenderness, and his lipase and LFTs are normal.  No evidence of any clinical significance to this finding.  Patient is feeling much better and would like to go home.  I will discharge with Keflex for UTI/cystitis.  UA also reveals a yeast so I will treat for this as well.  Return precautions given, and the patient expresses understanding. ____________________________________________   FINAL CLINICAL IMPRESSION(S) / ED DIAGNOSES  Final diagnoses:  Acute cystitis without hematuria      NEW MEDICATIONS STARTED DURING THIS VISIT:  New Prescriptions   No medications on file     Note:  This document was prepared using Dragon voice recognition software and may include unintentional dictation errors.       Arta Silence, MD 12/06/17 2302

## 2017-12-06 NOTE — ED Triage Notes (Signed)
Lower abd pain began at noon today.

## 2017-12-06 NOTE — ED Notes (Signed)
FN: pt brought over from [facility] for further eval of 5/10 abd pain, near [employer].

## 2018-09-26 ENCOUNTER — Encounter (INDEPENDENT_AMBULATORY_CARE_PROVIDER_SITE_OTHER): Payer: Medicare Other | Admitting: Ophthalmology

## 2018-09-26 DIAGNOSIS — H47213 Primary optic atrophy, bilateral: Secondary | ICD-10-CM | POA: Diagnosis not present

## 2018-09-26 DIAGNOSIS — I10 Essential (primary) hypertension: Secondary | ICD-10-CM

## 2018-09-26 DIAGNOSIS — H2512 Age-related nuclear cataract, left eye: Secondary | ICD-10-CM

## 2018-09-26 DIAGNOSIS — H338 Other retinal detachments: Secondary | ICD-10-CM

## 2018-09-26 DIAGNOSIS — H353131 Nonexudative age-related macular degeneration, bilateral, early dry stage: Secondary | ICD-10-CM

## 2018-09-26 DIAGNOSIS — H35033 Hypertensive retinopathy, bilateral: Secondary | ICD-10-CM

## 2018-09-26 DIAGNOSIS — H43813 Vitreous degeneration, bilateral: Secondary | ICD-10-CM

## 2018-11-29 ENCOUNTER — Encounter: Payer: Self-pay | Admitting: Student

## 2018-11-30 ENCOUNTER — Ambulatory Visit: Payer: Medicare Other | Admitting: Anesthesiology

## 2018-11-30 ENCOUNTER — Encounter
Admission: RE | Disposition: A | Discharge status: Home / Self Care | Payer: Self-pay | Source: Home / Self Care | Attending: Unknown Physician Specialty

## 2018-11-30 ENCOUNTER — Ambulatory Visit
Admission: RE | Admit: 2018-11-30 | Discharge: 2018-11-30 | Disposition: A | Discharge status: Home / Self Care | Payer: Medicare Other | Attending: Unknown Physician Specialty | Admitting: Unknown Physician Specialty

## 2018-11-30 DIAGNOSIS — I10 Essential (primary) hypertension: Secondary | ICD-10-CM | POA: Insufficient documentation

## 2018-11-30 DIAGNOSIS — D125 Benign neoplasm of sigmoid colon: Secondary | ICD-10-CM | POA: Insufficient documentation

## 2018-11-30 DIAGNOSIS — D122 Benign neoplasm of ascending colon: Secondary | ICD-10-CM | POA: Diagnosis not present

## 2018-11-30 DIAGNOSIS — Z79899 Other long term (current) drug therapy: Secondary | ICD-10-CM | POA: Insufficient documentation

## 2018-11-30 DIAGNOSIS — K640 First degree hemorrhoids: Secondary | ICD-10-CM | POA: Insufficient documentation

## 2018-11-30 DIAGNOSIS — R1310 Dysphagia, unspecified: Secondary | ICD-10-CM | POA: Diagnosis present

## 2018-11-30 DIAGNOSIS — K3189 Other diseases of stomach and duodenum: Secondary | ICD-10-CM | POA: Diagnosis not present

## 2018-11-30 DIAGNOSIS — J45909 Unspecified asthma, uncomplicated: Secondary | ICD-10-CM | POA: Diagnosis not present

## 2018-11-30 DIAGNOSIS — K5730 Diverticulosis of large intestine without perforation or abscess without bleeding: Secondary | ICD-10-CM | POA: Insufficient documentation

## 2018-11-30 DIAGNOSIS — Z1211 Encounter for screening for malignant neoplasm of colon: Secondary | ICD-10-CM | POA: Insufficient documentation

## 2018-11-30 DIAGNOSIS — K2980 Duodenitis without bleeding: Secondary | ICD-10-CM | POA: Insufficient documentation

## 2018-11-30 DIAGNOSIS — Z86010 Personal history of colonic polyps: Secondary | ICD-10-CM | POA: Diagnosis not present

## 2018-11-30 DIAGNOSIS — K2270 Barrett's esophagus without dysplasia: Secondary | ICD-10-CM | POA: Insufficient documentation

## 2018-11-30 DIAGNOSIS — N400 Enlarged prostate without lower urinary tract symptoms: Secondary | ICD-10-CM | POA: Diagnosis not present

## 2018-11-30 DIAGNOSIS — Z791 Long term (current) use of non-steroidal anti-inflammatories (NSAID): Secondary | ICD-10-CM | POA: Diagnosis not present

## 2018-11-30 DIAGNOSIS — Z8711 Personal history of peptic ulcer disease: Secondary | ICD-10-CM | POA: Insufficient documentation

## 2018-11-30 DIAGNOSIS — K210 Gastro-esophageal reflux disease with esophagitis: Secondary | ICD-10-CM | POA: Diagnosis not present

## 2018-11-30 DIAGNOSIS — K319 Disease of stomach and duodenum, unspecified: Secondary | ICD-10-CM | POA: Insufficient documentation

## 2018-11-30 DIAGNOSIS — G4730 Sleep apnea, unspecified: Secondary | ICD-10-CM | POA: Insufficient documentation

## 2018-11-30 DIAGNOSIS — Z87891 Personal history of nicotine dependence: Secondary | ICD-10-CM | POA: Diagnosis not present

## 2018-11-30 DIAGNOSIS — Z7951 Long term (current) use of inhaled steroids: Secondary | ICD-10-CM | POA: Diagnosis not present

## 2018-11-30 HISTORY — PX: ESOPHAGOGASTRODUODENOSCOPY (EGD) WITH PROPOFOL: SHX5813

## 2018-11-30 HISTORY — DX: Unspecified osteoarthritis, unspecified site: M19.90

## 2018-11-30 HISTORY — DX: Pure hypercholesterolemia, unspecified: E78.00

## 2018-11-30 HISTORY — DX: Personal history of colonic polyps: Z86.010

## 2018-11-30 HISTORY — DX: Benign prostatic hyperplasia without lower urinary tract symptoms: N40.0

## 2018-11-30 HISTORY — DX: Testicular hypofunction: E29.1

## 2018-11-30 HISTORY — DX: Anemia, unspecified: D64.9

## 2018-11-30 HISTORY — DX: Sleep apnea, unspecified: G47.30

## 2018-11-30 HISTORY — DX: Personal history of diseases of the skin and subcutaneous tissue: Z87.2

## 2018-11-30 HISTORY — PX: COLONOSCOPY WITH PROPOFOL: SHX5780

## 2018-11-30 HISTORY — DX: Other specified enthesopathies of unspecified lower limb, excluding foot: M76.899

## 2018-11-30 HISTORY — DX: Tinea unguium: B35.1

## 2018-11-30 HISTORY — DX: Personal history of colon polyps, unspecified: Z86.0100

## 2018-11-30 HISTORY — DX: Hypersomnia, unspecified: G47.10

## 2018-11-30 SURGERY — COLONOSCOPY WITH PROPOFOL
Anesthesia: General

## 2018-11-30 MED ORDER — SODIUM CHLORIDE 0.9 % IV SOLN
INTRAVENOUS | Status: DC
  Administered 2018-11-30: 08:00:00 via INTRAVENOUS

## 2018-11-30 MED ORDER — PROPOFOL 10 MG/ML IV BOLUS
INTRAVENOUS | Status: AC
  Filled 2018-11-30: qty 20

## 2018-11-30 MED ORDER — PHENYLEPHRINE HCL 10 MG/ML IJ SOLN
INTRAMUSCULAR | Status: DC | PRN
  Administered 2018-11-30: 100 ug via INTRAVENOUS

## 2018-11-30 MED ORDER — PROPOFOL 500 MG/50ML IV EMUL
INTRAVENOUS | Status: AC
  Filled 2018-11-30: qty 50

## 2018-11-30 MED ORDER — PROPOFOL 10 MG/ML IV BOLUS
INTRAVENOUS | Status: DC | PRN
  Administered 2018-11-30: 100 mg via INTRAVENOUS

## 2018-11-30 MED ORDER — GLYCOPYRROLATE 0.2 MG/ML IJ SOLN
INTRAMUSCULAR | Status: AC
  Filled 2018-11-30: qty 1

## 2018-11-30 MED ORDER — FENTANYL CITRATE (PF) 100 MCG/2ML IJ SOLN
INTRAMUSCULAR | Status: AC
  Filled 2018-11-30: qty 2

## 2018-11-30 MED ORDER — PROPOFOL 10 MG/ML IV BOLUS
INTRAVENOUS | Status: AC
Start: 2018-11-30 — End: ?
  Filled 2018-11-30: qty 20

## 2018-11-30 MED ORDER — LABETALOL HCL 5 MG/ML IV SOLN
INTRAVENOUS | Status: AC
  Filled 2018-11-30: qty 4

## 2018-11-30 MED ORDER — LIDOCAINE 2% (20 MG/ML) 5 ML SYRINGE
INTRAMUSCULAR | Status: DC | PRN
  Administered 2018-11-30: 40 mg via INTRAVENOUS

## 2018-11-30 MED ORDER — LIDOCAINE HCL (PF) 2 % IJ SOLN
INTRAMUSCULAR | Status: AC
  Filled 2018-11-30: qty 10

## 2018-11-30 MED ORDER — PROPOFOL 500 MG/50ML IV EMUL
INTRAVENOUS | Status: DC | PRN
  Administered 2018-11-30: 170 ug/kg/min via INTRAVENOUS

## 2018-11-30 MED ORDER — EPHEDRINE SULFATE 50 MG/ML IJ SOLN
INTRAMUSCULAR | Status: AC
  Filled 2018-11-30: qty 1

## 2018-11-30 MED ORDER — FENTANYL CITRATE (PF) 100 MCG/2ML IJ SOLN
INTRAMUSCULAR | Status: DC | PRN
  Administered 2018-11-30 (×2): 50 ug via INTRAVENOUS

## 2018-11-30 MED ORDER — SODIUM CHLORIDE 0.9 % IV SOLN: INTRAVENOUS | Status: DC

## 2018-11-30 NOTE — Anesthesia Preprocedure Evaluation (Addendum)
Anesthesia Evaluation  Patient identified by MRN, date of birth, ID band Patient awake    Reviewed: Allergy & Precautions, H&P , NPO status , Patient's Chart, lab work & pertinent test results  History of Anesthesia Complications (+) history of anesthetic complications ("I was told something happened during my last colonoscopy but I'm not sure what")  Airway Mallampati: III       Dental  (+) Teeth Intact   Pulmonary asthma , sleep apnea , former smoker,           Cardiovascular hypertension,      Neuro/Psych negative neurological ROS  negative psych ROS   GI/Hepatic Neg liver ROS, PUD,   Endo/Other  negative endocrine ROS  Renal/GU negative Renal ROS  negative genitourinary   Musculoskeletal  (+) Arthritis ,   Abdominal   Peds  Hematology  (+) Blood dyscrasia, anemia ,   Anesthesia Other Findings Past Medical History: No date: Anemia No date: Arthritis No date: Asthma No date: Back pain No date: BPH (benign prostatic hyperplasia) No date: Dermatophytosis of nail No date: Enthesopathy of hip region No date: Gastric ulcer No date: H/O multiple allergies No date: History of colonic polyps No date: Hx of onychia and paronychia No date: Hypersomnia No date: Hypertension No date: Lower extremity edema No date: Pneumonia No date: Prostatic hypertrophy No date: Pure hypercholesterolemia No date: Sleep apnea 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 No date: Tremor     Comment:  of the head No date: Wheezing  Past Surgical History: 09/01/2016: CATARACT EXTRACTION W/PHACO; Right     Comment:  Procedure: CATARACT EXTRACTION PHACO AND INTRAOCULAR               LENS PLACEMENT (IOC);  Surgeon: Birder Robson, MD;                Location: [facility] ORS;  Service: Ophthalmology;  Laterality:              Right;  Lot# [id number] H [place]: 00:34.0 AP%:21.8 CDE: 7.39  No date: sleep apnea surgery No date: TONSILLECTOMY      Reproductive/Obstetrics negative OB ROS                            Anesthesia Physical Anesthesia Plan  ASA: III  Anesthesia Plan: General   Post-op Pain Management:    Induction:   PONV Risk Score and Plan: Propofol infusion and TIVA  Airway Management Planned:   Additional Equipment:   Intra-op Plan:   Post-operative Plan:   Informed Consent: I have reviewed the patients History and Physical, chart, labs and discussed the procedure including the risks, benefits and alternatives for the proposed anesthesia with the patient or authorized representative who has indicated his/her understanding and acceptance.     Dental Advisory Given  Plan Discussed with: Anesthesiologist and CRNA  Anesthesia Plan Comments:         Anesthesia Quick Evaluation

## 2018-11-30 NOTE — Transfer of Care (Signed)
Immediate Anesthesia Transfer of Care Note  Patient: Zachary Chase  Procedure(s) Performed: COLONOSCOPY WITH PROPOFOL (N/A ) ESOPHAGOGASTRODUODENOSCOPY (EGD) WITH PROPOFOL (N/A )  Patient Location: PACU and Endoscopy Unit  Anesthesia Type:General  Level of Consciousness: sedated  Airway & Oxygen Therapy: Patient Spontanous Breathing and Patient connected to face mask oxygen  Post-op Assessment: Report given to RN and Post -op Vital signs reviewed and stable  Post vital signs: Reviewed and stable  Last Vitals:  Vitals Value Taken Time  BP    Temp    Pulse 74 11/30/2018  8:59 AM  Resp 20 11/30/2018  8:59 AM  SpO2 100 % 11/30/2018  8:59 AM  Vitals shown include unvalidated device data.  Last Pain:  Vitals:   11/30/18 0730  TempSrc: Tympanic  PainSc: 2          Complications: No apparent anesthesia complications

## 2018-11-30 NOTE — Op Note (Signed)
[facility] Gastroenterology Patient Name: Zachary Chase Procedure Date: 11/30/2018 7:53 AM MRN: [id number] Account #: [id number] Date of Birth: 1946-01-14 Admit Type: Outpatient Age: 73 Room: [facility] ENDO ROOM 3 Gender: Male Note Status: Finalized Procedure:            Upper GI endoscopy Indications:          Dysphagia Providers:            Manya Silvas, MD Medicines:            Propofol per Anesthesia Complications:        No immediate complications. Procedure:            Pre-Anesthesia Assessment:                       - After reviewing the risks and benefits, the patient                        was deemed in satisfactory condition to undergo the                        procedure.                       - After reviewing the risks and benefits, the patient                        was deemed in satisfactory condition to undergo the                        procedure.                       After obtaining informed consent, the endoscope was                        passed under direct vision. Throughout the procedure,                        the patient's blood pressure, pulse, and oxygen                        saturations were monitored continuously. The Endoscope                        was introduced through the mouth, and advanced to the                        second part of duodenum. The upper GI endoscopy was                        somewhat difficult due to the patient's body habitus.                        The patient tolerated the procedure. Findings:      There were esophageal mucosal changes consistent with short-segment       Barrett's esophagus present in the distal esophagus. The maximum       longitudinal extent of these mucosal changes was 2-3 cm in length.       Mucosa was biopsied with a cold forceps for histology. One specimen  bottle was sent to pathology.      Patchy mildly erythematous mucosa without bleeding was found in the       gastric  antrum. Biopsies were taken with a cold forceps for histology.       Biopsies were taken with a cold forceps for Helicobacter pylori testing.      A single diminutive nodule with a localized distribution was found in       the duodenal bulb. Biopsies were taken with a cold forceps for histology. Impression:           - Esophageal mucosal changes consistent with                        short-segment Barrett's esophagus. Biopsied.                       - Erythematous mucosa in the antrum. Biopsied.                       - Nodule found in the duodenum. Biopsied. Recommendation:       - Await pathology results. Manya Silvas, MD 11/30/2018 8:30:53 AM This report has been signed electronically. Number of Addenda: 0 Note Initiated On: 11/30/2018 7:53 AM      [facility]

## 2018-11-30 NOTE — H&P (Signed)
Primary Care Physician:  Juluis Pitch, MD Primary Gastroenterologist:  Dr. Vira Agar  Pre-Procedure History & Physical: HPI:  Zachary Chase is a 73 y.o. male is here for an endoscopy and colonoscopy.   Past Medical History:  Diagnosis Date  . Anemia   . Arthritis   . Asthma   . Back pain   . BPH (benign prostatic hyperplasia)   . Dermatophytosis of nail   . Enthesopathy of hip region   . Gastric ulcer   . H/O multiple allergies   . History of colonic polyps   . Hx of onychia and paronychia   . Hypersomnia   . Hypertension   . Lower extremity edema   . Pneumonia   . Prostatic hypertrophy   . Pure hypercholesterolemia   . Sleep apnea   . Testicular hypofunction   . Tremor    of the head  . Wheezing     Past Surgical History:  Procedure Laterality Date  . CATARACT EXTRACTION W/PHACO Right 09/01/2016   Procedure: CATARACT EXTRACTION PHACO AND INTRAOCULAR LENS PLACEMENT (IOC);  Surgeon: Birder Robson, MD;  Location: [facility] ORS;  Service: Ophthalmology;  Laterality: Right;  Lot# [id number] H [place]: 00:34.0 AP%:21.8 CDE: 7.39   . sleep apnea surgery    . TONSILLECTOMY      Prior to Admission medications   Medication Sig Start Date End Date Taking? Authorizing Provider  acetaminophen (TYLENOL) 500 MG tablet Take 500 mg by mouth every 6 (six) hours as needed.   Yes [provider]  albuterol (PROVENTIL HFA;VENTOLIN HFA) 108 (90 Base) MCG/ACT inhaler Inhale 1 puff into the lungs every 4 (four) hours as needed for wheezing or shortness of breath.   Yes [provider]  augmented betamethasone dipropionate (DIPROLENE-AF) 0.05 % ointment Apply 1 application topically 2 (two) times daily. 08/23/16  Yes [provider]  betamethasone dipropionate (DIPROLENE) 0.05 % cream Apply 1 Dose topically 2 (two) times daily.   Yes [provider]  budesonide-formoterol (SYMBICORT) 80-4.5 MCG/ACT inhaler Inhale 2 puffs into the lungs 2 (two) times  daily.   Yes [provider]  fexofenadine (ALLEGRA) 180 MG tablet Take 180 mg by mouth daily.   Yes [provider]  finasteride (PROSCAR) 5 MG tablet Take 5 mg by mouth daily.   Yes [provider]  fluticasone (FLONASE) 50 MCG/ACT nasal spray Place 1 spray into both nostrils daily as needed for allergies or rhinitis.   Yes [provider]  glucosamine-chondroitin 500-400 MG tablet Take 1 tablet by mouth 3 (three) times daily.   Yes [provider]  hydrocortisone cream 1 % Apply 1 application topically 2 (two) times daily.   Yes [provider]  ketotifen (ALAWAY) 0.025 % ophthalmic solution Place 1 drop into both eyes 2 (two) times daily as needed.   Yes [provider]  lisinopril-hydrochlorothiazide (PRINZIDE,ZESTORETIC) 20-12.5 MG tablet Take 0.5 tablets by mouth daily.   Yes [provider]  Melatonin 10 MG TABS Take 1 tablet by mouth at bedtime.   Yes [provider]  Multiple Vitamins-Minerals (HAIR SKIN AND NAILS FORMULA) TABS Take 1 tablet by mouth daily.   Yes [provider]  naproxen sodium (ALEVE) 220 MG tablet Take 220 mg by mouth.   Yes [provider]  tamsulosin (FLOMAX) 0.4 MG CAPS capsule Take 0.4 mg by mouth 2 (two) times daily.   Yes [provider]  celecoxib (CELEBREX) 200 MG capsule Take 200 mg by mouth daily.  [provider]  docusate sodium (COLACE) 100 MG capsule Take 200 mg by mouth at bedtime.    [provider]  HYDROcodone-acetaminophen (NORCO) 5-325 MG tablet Take 1 tablet by mouth every 6 (six) hours as needed for severe pain. 12/06/17   Arta Silence, MD    Allergies as of 09/21/2018 - Review Complete 12/06/2017  Allergen Reaction Noted  . Erythromycin Nausea And Vomiting 12/06/2017    History reviewed. No pertinent family history.  Social History   Socioeconomic History  . Marital status: Married    Spouse name: Not on  file  . Number of children: Not on file  . Years of education: Not on file  . Highest education level: Not on file  Occupational History  . Not on file  Social Needs  . Financial resource strain: Not on file  . Food insecurity:    Worry: Not on file    Inability: Not on file  . Transportation needs:    Medical: Not on file    Non-medical: Not on file  Tobacco Use  . Smoking status: Former Research scientist (life sciences)  . Smokeless tobacco: Never Used  Substance and Sexual Activity  . Alcohol use: Yes    Comment: occasional none last 24hrs  . Drug use: Not Currently  . Sexual activity: Not on file  Lifestyle  . Physical activity:    Days per week: Not on file    Minutes per session: Not on file  . Stress: Not on file  Relationships  . Social connections:    Talks on phone: Not on file    Gets together: Not on file    Attends religious service: Not on file    Active member of club or organization: Not on file    Attends meetings of clubs or organizations: Not on file    Relationship status: Not on file  . Intimate partner violence:    Fear of current or ex partner: Not on file    Emotionally abused: Not on file    Physically abused: Not on file    Forced sexual activity: Not on file  Other Topics Concern  . Not on file  Social History Narrative  . Not on file    Review of Systems: See HPI, otherwise negative ROS  Physical Exam: BP 136/75   Pulse 74   Temp 97.9 F (36.6 C) (Tympanic)   Resp 18   Ht 5\' 3"  (1.6 m)   Wt 109.8 kg   SpO2 96%   BMI 42.87 kg/m  General:   Alert,  pleasant and cooperative in NAD Head:  Normocephalic and atraumatic. Neck:  Supple; no masses or thyromegaly. Lungs:  Clear throughout to auscultation.    Heart:  Regular rate and rhythm. Abdomen:  Soft, nontender and nondistended. Normal bowel sounds, without guarding, and without rebound.   Neurologic:  Alert and  oriented x4;  grossly normal neurologically.  Impression/Plan: Zachary Chase is  here for an endoscopy and colonoscopy to be performed for [facility] colon polyps and dysphagia.  08/31/2013 was last endoscopic procedures  Risks, benefits, limitations, and alternatives regarding  endoscopy and colonoscopy have been reviewed with the patient.  Questions have been answered.  All parties agreeable.   Gaylyn Cheers, MD  11/30/2018, 8:09 AM

## 2018-11-30 NOTE — Op Note (Signed)
[facility] Gastroenterology Patient Name: Zachary Chase Procedure Date: 11/30/2018 7:52 AM MRN: [id number] Account #: [id number] Date of Birth: 08-17-1946 Admit Type: Outpatient Age: 74 Room: [facility] ENDO ROOM 3 Gender: Male Note Status: Finalized Procedure:            Colonoscopy Indications:          High risk colon cancer surveillance: Personal history                        of colonic polyps Providers:            Manya Silvas, MD Medicines:            Propofol per Anesthesia Complications:        No immediate complications. Procedure:            Pre-Anesthesia Assessment:                       - After reviewing the risks and benefits, the patient                        was deemed in satisfactory condition to undergo the                        procedure.                       After obtaining informed consent, the colonoscope was                        passed under direct vision. Throughout the procedure,                        the patient's blood pressure, pulse, and oxygen                        saturations were monitored continuously. The                        Colonoscope was introduced through the anus and                        advanced to the the cecum, identified by appendiceal                        orifice and ileocecal valve. The colonoscopy was                        performed without difficulty. The patient tolerated the                        procedure well. The quality of the bowel preparation                        was adequate to identify polyps. Findings:      A diminutive polyp was found in the ascending colon. The polyp was       sessile. Biopsies were taken with a cold forceps for histology. Jar 1.      Two sessile polyps were found in the sigmoid colon. The polyps were       small in size. These polyps were removed with  a hot snare. Resection and       retrieval were complete. Jar 2.      Multiple small-mouthed diverticula were found  in the sigmoid colon,       descending colon and transverse colon.      Internal hemorrhoids were found during endoscopy. The hemorrhoids were       Grade I (internal hemorrhoids that do not prolapse) and Grade II       (internal hemorrhoids that prolapse but reduce spontaneously).      The exam was otherwise without abnormality. Impression:           - One diminutive polyp in the ascending colon. Biopsied.                       - Two small polyps in the sigmoid colon, removed with a                        hot snare. Resected and retrieved.                       - Diverticulosis in the sigmoid colon, in the                        descending colon and in the transverse colon.                       - Internal hemorrhoids.                       - The examination was otherwise normal. Recommendation:       - Await pathology results. Manya Silvas, MD 11/30/2018 8:58:48 AM This report has been signed electronically. Number of Addenda: 0 Note Initiated On: 11/30/2018 7:52 AM Scope Withdrawal Time: 0 hours 17 minutes 26 seconds  Total Procedure Duration: 0 hours 19 minutes 13 seconds       [facility]

## 2018-11-30 NOTE — Anesthesia Post-op Follow-up Note (Signed)
Anesthesia QCDR form completed.        

## 2018-12-01 NOTE — Anesthesia Postprocedure Evaluation (Signed)
Anesthesia Post Note  Patient: Zachary Chase  Procedure(s) Performed: COLONOSCOPY WITH PROPOFOL (N/A ) ESOPHAGOGASTRODUODENOSCOPY (EGD) WITH PROPOFOL (N/A )  Patient location during evaluation: PACU Anesthesia Type: General Level of consciousness: awake and alert Pain management: pain level controlled Vital Signs Assessment: post-procedure vital signs reviewed and stable Respiratory status: spontaneous breathing, nonlabored ventilation and respiratory function stable Cardiovascular status: blood pressure returned to baseline and stable Postop Assessment: no apparent nausea or vomiting Anesthetic complications: no     Last Vitals:  Vitals:   11/30/18 0910 11/30/18 0920  BP: (!) 97/56 104/62  Pulse: 74 74  Resp: (!) 21 17  Temp:    SpO2: 95% 94%    Last Pain:  Vitals:   11/30/18 0920  TempSrc:   PainSc: 0-No pain                 Durenda Hurt

## 2018-12-02 LAB — SURGICAL PATHOLOGY

## 2018-12-26 ENCOUNTER — Encounter (INDEPENDENT_AMBULATORY_CARE_PROVIDER_SITE_OTHER): Payer: 59 | Admitting: Ophthalmology

## 2019-05-05 DIAGNOSIS — Z87891 Personal history of nicotine dependence: Secondary | ICD-10-CM

## 2019-05-05 DIAGNOSIS — R0789 Other chest pain: Secondary | ICD-10-CM | POA: Diagnosis present

## 2019-05-05 DIAGNOSIS — R109 Unspecified abdominal pain: Secondary | ICD-10-CM | POA: Diagnosis not present

## 2019-05-05 DIAGNOSIS — A4159 Other Gram-negative sepsis: Principal | ICD-10-CM | POA: Diagnosis present

## 2019-05-05 DIAGNOSIS — Z882 Allergy status to sulfonamides status: Secondary | ICD-10-CM

## 2019-05-05 DIAGNOSIS — Z9841 Cataract extraction status, right eye: Secondary | ICD-10-CM

## 2019-05-05 DIAGNOSIS — Z6841 Body Mass Index (BMI) 40.0 and over, adult: Secondary | ICD-10-CM

## 2019-05-05 DIAGNOSIS — I10 Essential (primary) hypertension: Secondary | ICD-10-CM | POA: Diagnosis present

## 2019-05-05 DIAGNOSIS — Z881 Allergy status to other antibiotic agents status: Secondary | ICD-10-CM

## 2019-05-05 DIAGNOSIS — Z961 Presence of intraocular lens: Secondary | ICD-10-CM | POA: Diagnosis present

## 2019-05-05 DIAGNOSIS — K810 Acute cholecystitis: Secondary | ICD-10-CM | POA: Diagnosis present

## 2019-05-05 DIAGNOSIS — N400 Enlarged prostate without lower urinary tract symptoms: Secondary | ICD-10-CM | POA: Diagnosis present

## 2019-05-05 DIAGNOSIS — K210 Gastro-esophageal reflux disease with esophagitis: Secondary | ICD-10-CM | POA: Diagnosis present

## 2019-05-05 DIAGNOSIS — R160 Hepatomegaly, not elsewhere classified: Secondary | ICD-10-CM | POA: Diagnosis present

## 2019-05-05 DIAGNOSIS — J45909 Unspecified asthma, uncomplicated: Secondary | ICD-10-CM | POA: Diagnosis present

## 2019-05-05 DIAGNOSIS — K429 Umbilical hernia without obstruction or gangrene: Secondary | ICD-10-CM | POA: Diagnosis present

## 2019-05-05 DIAGNOSIS — E785 Hyperlipidemia, unspecified: Secondary | ICD-10-CM | POA: Diagnosis present

## 2019-05-05 DIAGNOSIS — G4733 Obstructive sleep apnea (adult) (pediatric): Secondary | ICD-10-CM | POA: Diagnosis present

## 2019-05-05 DIAGNOSIS — N179 Acute kidney failure, unspecified: Secondary | ICD-10-CM | POA: Diagnosis present

## 2019-05-05 DIAGNOSIS — Z1159 Encounter for screening for other viral diseases: Secondary | ICD-10-CM

## 2019-05-05 LAB — URINALYSIS, COMPLETE (UACMP) WITH MICROSCOPIC
Bacteria, UA: NONE SEEN
Bilirubin Urine: NEGATIVE
Glucose, UA: NEGATIVE mg/dL
Hgb urine dipstick: NEGATIVE
Ketones, ur: NEGATIVE mg/dL
Leukocytes,Ua: NEGATIVE
Nitrite: NEGATIVE
Protein, ur: NEGATIVE mg/dL
Specific Gravity, Urine: 1.015 (ref 1.005–1.030)
pH: 6 (ref 5.0–8.0)

## 2019-05-05 LAB — CBC
HCT: 41 % (ref 39.0–52.0)
Hemoglobin: 13.7 g/dL (ref 13.0–17.0)
MCH: 30.4 pg (ref 26.0–34.0)
MCHC: 33.4 g/dL (ref 30.0–36.0)
MCV: 91.1 fL (ref 80.0–100.0)
Platelets: 213 10*3/uL (ref 150–400)
RBC: 4.5 MIL/uL (ref 4.22–5.81)
RDW: 13.9 % (ref 11.5–15.5)
WBC: 9 10*3/uL (ref 4.0–10.5)
nRBC: 0 % (ref 0.0–0.2)

## 2019-05-05 LAB — COMPREHENSIVE METABOLIC PANEL
ALT: 24 U/L (ref 0–44)
AST: 23 U/L (ref 15–41)
Albumin: 4.3 g/dL (ref 3.5–5.0)
Alkaline Phosphatase: 99 U/L (ref 38–126)
Anion gap: 9 (ref 5–15)
BUN: 19 mg/dL (ref 8–23)
CO2: 25 mmol/L (ref 22–32)
Calcium: 9 mg/dL (ref 8.9–10.3)
Chloride: 103 mmol/L (ref 98–111)
Creatinine, Ser: 1.17 mg/dL (ref 0.61–1.24)
GFR calc Af Amer: >60 mL/min (ref >60)
GFR calc non Af Amer: >60 mL/min (ref >60)
Glucose, Bld: 160 mg/dL — ABNORMAL HIGH (ref 70–99)
Potassium: 4.6 mmol/L (ref 3.5–5.1)
Sodium: 137 mmol/L (ref 135–145)
Total Bilirubin: 0.5 mg/dL (ref 0.3–1.2)
Total Protein: 7.9 g/dL (ref 6.5–8.1)

## 2019-05-05 LAB — LIPASE, BLOOD: Lipase: 36 U/L (ref 11–51)

## 2019-05-05 NOTE — ED Triage Notes (Signed)
Patient c/o mid abdominal pain beginning at 1730. Patient denies any accompanying symptoms.

## 2019-05-06 ENCOUNTER — Other Ambulatory Visit: Payer: Self-pay

## 2019-05-06 ENCOUNTER — Inpatient Hospital Stay
Admission: EM | Admit: 2019-05-06 | Discharge: 2019-05-10 | DRG: 854 | Disposition: A | Discharge status: Home / Self Care | Payer: Medicare Other | Attending: Internal Medicine | Admitting: Internal Medicine

## 2019-05-06 ENCOUNTER — Emergency Department: Payer: Medicare Other

## 2019-05-06 ENCOUNTER — Encounter: Payer: Self-pay | Admitting: Radiology

## 2019-05-06 DIAGNOSIS — R109 Unspecified abdominal pain: Secondary | ICD-10-CM | POA: Diagnosis present

## 2019-05-06 DIAGNOSIS — R778 Other specified abnormalities of plasma proteins: Secondary | ICD-10-CM

## 2019-05-06 DIAGNOSIS — R1010 Upper abdominal pain, unspecified: Secondary | ICD-10-CM

## 2019-05-06 DIAGNOSIS — K819 Cholecystitis, unspecified: Secondary | ICD-10-CM

## 2019-05-06 DIAGNOSIS — R079 Chest pain, unspecified: Secondary | ICD-10-CM | POA: Diagnosis present

## 2019-05-06 DIAGNOSIS — R000 Tachycardia, unspecified: Secondary | ICD-10-CM

## 2019-05-06 LAB — BLOOD GAS, ARTERIAL
Acid-Base Excess: 1.5 mmol/L (ref 0.0–2.0)
Bicarbonate: 24.7 mmol/L (ref 20.0–28.0)
O2 Saturation: 95.1 %
Patient temperature: 37
pCO2 arterial: 34 mmHg (ref 32.0–48.0)
pH, Arterial: 7.47 — ABNORMAL HIGH (ref 7.350–7.450)
pO2, Arterial: 71 mmHg — ABNORMAL LOW (ref 83.0–108.0)

## 2019-05-06 LAB — SARS CORONAVIRUS 2 BY RT PCR (HOSPITAL ORDER, PERFORMED IN ~~LOC~~ HOSPITAL LAB): SARS Coronavirus 2: NEGATIVE

## 2019-05-06 LAB — TSH: TSH: 0.792 u[IU]/mL (ref 0.350–4.500)

## 2019-05-06 LAB — TROPONIN I (HIGH SENSITIVITY)
Troponin I (High Sensitivity): 5 ng/L (ref <18)
Troponin I (High Sensitivity): 10 ng/L (ref <18)
Troponin I (High Sensitivity): 14 ng/L (ref <18)
Troponin I (High Sensitivity): 14 ng/L (ref <18)

## 2019-05-06 MED ORDER — ENOXAPARIN SODIUM 40 MG/0.4ML ~~LOC~~ SOLN
40.0000 mg | Freq: Two times a day (BID) | SUBCUTANEOUS | Status: DC
  Administered 2019-05-06 – 2019-05-10 (×9): 40 mg via SUBCUTANEOUS
  Filled 2019-05-06 (×9): qty 0.4

## 2019-05-06 MED ORDER — IPRATROPIUM BROMIDE 0.06 % NA SOLN
2.0000 | Freq: Three times a day (TID) | NASAL | Status: DC | PRN
  Filled 2019-05-06: qty 15

## 2019-05-06 MED ORDER — ONDANSETRON HCL 4 MG PO TABS: 4.0000 mg | ORAL_TABLET | Freq: Four times a day (QID) | ORAL | Status: DC | PRN

## 2019-05-06 MED ORDER — ONDANSETRON HCL 4 MG/2ML IJ SOLN
4.0000 mg | Freq: Four times a day (QID) | INTRAMUSCULAR | Status: DC | PRN
  Administered 2019-05-07: 4 mg via INTRAVENOUS

## 2019-05-06 MED ORDER — MELATONIN 5 MG PO TABS
10.0000 mg | ORAL_TABLET | Freq: Every day | ORAL | Status: DC
  Administered 2019-05-08 – 2019-05-09 (×2): 10 mg via ORAL
  Filled 2019-05-06 (×5): qty 2

## 2019-05-06 MED ORDER — FINASTERIDE 5 MG PO TABS
5.0000 mg | ORAL_TABLET | Freq: Every day | ORAL | Status: DC
  Administered 2019-05-06 – 2019-05-10 (×5): 5 mg via ORAL
  Filled 2019-05-06 (×5): qty 1

## 2019-05-06 MED ORDER — LORATADINE 10 MG PO TABS
10.0000 mg | ORAL_TABLET | Freq: Every day | ORAL | Status: DC
  Administered 2019-05-06 – 2019-05-10 (×5): 10 mg via ORAL
  Filled 2019-05-06 (×5): qty 1

## 2019-05-06 MED ORDER — FLUTICASONE PROPIONATE 50 MCG/ACT NA SUSP
2.0000 | Freq: Every day | NASAL | Status: DC | PRN
  Filled 2019-05-06: qty 16

## 2019-05-06 MED ORDER — ACETAMINOPHEN 325 MG PO TABS
650.0000 mg | ORAL_TABLET | Freq: Four times a day (QID) | ORAL | Status: DC | PRN
  Administered 2019-05-06 – 2019-05-07 (×2): 650 mg via ORAL
  Filled 2019-05-06 (×2): qty 2

## 2019-05-06 MED ORDER — PANTOPRAZOLE SODIUM 40 MG IV SOLR
40.0000 mg | Freq: Two times a day (BID) | INTRAVENOUS | Status: DC
  Administered 2019-05-06 – 2019-05-10 (×8): 40 mg via INTRAVENOUS
  Filled 2019-05-06 (×8): qty 40

## 2019-05-06 MED ORDER — ALBUTEROL SULFATE (2.5 MG/3ML) 0.083% IN NEBU
2.5000 mg | INHALATION_SOLUTION | RESPIRATORY_TRACT | Status: DC | PRN
  Administered 2019-05-06 – 2019-05-07 (×2): 2.5 mg via RESPIRATORY_TRACT
  Filled 2019-05-06 (×2): qty 3

## 2019-05-06 MED ORDER — PANTOPRAZOLE SODIUM 40 MG IV SOLR
40.0000 mg | Freq: Once | INTRAVENOUS | Status: AC
  Administered 2019-05-06: 05:00:00 40 mg via INTRAVENOUS
  Filled 2019-05-06: qty 40

## 2019-05-06 MED ORDER — SUCRALFATE 1 G PO TABS
1.0000 g | ORAL_TABLET | Freq: Once | ORAL | Status: AC
  Administered 2019-05-06: 1 g via ORAL
  Filled 2019-05-06: qty 1

## 2019-05-06 MED ORDER — HYDROCHLOROTHIAZIDE 12.5 MG PO CAPS
12.5000 mg | ORAL_CAPSULE | Freq: Every day | ORAL | Status: DC
  Administered 2019-05-07: 12.5 mg via ORAL
  Filled 2019-05-06 (×2): qty 1

## 2019-05-06 MED ORDER — ONDANSETRON HCL 4 MG/2ML IJ SOLN
4.0000 mg | INTRAMUSCULAR | Status: AC
  Administered 2019-05-06: 4 mg via INTRAVENOUS
  Filled 2019-05-06: qty 2

## 2019-05-06 MED ORDER — DOCUSATE SODIUM 100 MG PO CAPS
100.0000 mg | ORAL_CAPSULE | Freq: Two times a day (BID) | ORAL | Status: DC
  Administered 2019-05-06 – 2019-05-10 (×5): 100 mg via ORAL
  Filled 2019-05-06 (×8): qty 1

## 2019-05-06 MED ORDER — SODIUM CHLORIDE 0.9 % IV SOLN
INTRAVENOUS | Status: AC
  Administered 2019-05-06: 23:00:00 via INTRAVENOUS

## 2019-05-06 MED ORDER — LIDOCAINE VISCOUS HCL 2 % MT SOLN
15.0000 mL | Freq: Once | OROMUCOSAL | Status: AC
  Administered 2019-05-06: 15 mL via ORAL
  Filled 2019-05-06: qty 15

## 2019-05-06 MED ORDER — FAMOTIDINE 20 MG PO TABS: 20.0000 mg | ORAL_TABLET | Freq: Every evening | ORAL | Status: DC | PRN

## 2019-05-06 MED ORDER — MOMETASONE FURO-FORMOTEROL FUM 200-5 MCG/ACT IN AERO
2.0000 | INHALATION_SPRAY | Freq: Two times a day (BID) | RESPIRATORY_TRACT | Status: DC
  Administered 2019-05-06 – 2019-05-10 (×8): 2 via RESPIRATORY_TRACT
  Filled 2019-05-06: qty 8.8

## 2019-05-06 MED ORDER — FERROUS SULFATE 325 (65 FE) MG PO TABS
325.0000 mg | ORAL_TABLET | Freq: Every day | ORAL | Status: DC
  Administered 2019-05-06 – 2019-05-10 (×5): 325 mg via ORAL
  Filled 2019-05-06 (×5): qty 1

## 2019-05-06 MED ORDER — ACETAMINOPHEN 650 MG RE SUPP: 650.0000 mg | Freq: Four times a day (QID) | RECTAL | Status: DC | PRN

## 2019-05-06 MED ORDER — TAMSULOSIN HCL 0.4 MG PO CAPS
0.4000 mg | ORAL_CAPSULE | Freq: Two times a day (BID) | ORAL | Status: DC
  Administered 2019-05-06 – 2019-05-10 (×9): 0.4 mg via ORAL
  Filled 2019-05-06 (×9): qty 1

## 2019-05-06 MED ORDER — IBUPROFEN 400 MG PO TABS
400.0000 mg | ORAL_TABLET | Freq: Four times a day (QID) | ORAL | Status: DC | PRN
  Administered 2019-05-06: 400 mg via ORAL
  Filled 2019-05-06: qty 1

## 2019-05-06 MED ORDER — SUCRALFATE 1 G PO TABS
1.0000 g | ORAL_TABLET | Freq: Three times a day (TID) | ORAL | Status: DC
  Administered 2019-05-06 – 2019-05-10 (×13): 1 g via ORAL
  Filled 2019-05-06 (×13): qty 1

## 2019-05-06 MED ORDER — OXYCODONE-ACETAMINOPHEN 5-325 MG PO TABS
1.0000 | ORAL_TABLET | Freq: Three times a day (TID) | ORAL | Status: DC | PRN
  Administered 2019-05-06: 1 via ORAL
  Filled 2019-05-06: qty 1

## 2019-05-06 MED ORDER — ALUM & MAG HYDROXIDE-SIMETH 200-200-20 MG/5ML PO SUSP
30.0000 mL | Freq: Once | ORAL | Status: AC
  Administered 2019-05-06: 30 mL via ORAL
  Filled 2019-05-06: qty 30

## 2019-05-06 MED ORDER — LISINOPRIL-HYDROCHLOROTHIAZIDE 20-12.5 MG PO TABS: 1.0000 | ORAL_TABLET | Freq: Every day | ORAL | Status: DC

## 2019-05-06 MED ORDER — MORPHINE SULFATE (PF) 4 MG/ML IV SOLN
4.0000 mg | Freq: Once | INTRAVENOUS | Status: AC
  Administered 2019-05-06: 4 mg via INTRAVENOUS
  Filled 2019-05-06: qty 1

## 2019-05-06 MED ORDER — IOHEXOL 350 MG/ML SOLN
125.0000 mL | Freq: Once | INTRAVENOUS | Status: AC | PRN
  Administered 2019-05-06: 125 mL via INTRAVENOUS

## 2019-05-06 MED ORDER — LISINOPRIL 20 MG PO TABS
20.0000 mg | ORAL_TABLET | Freq: Every day | ORAL | Status: DC
  Administered 2019-05-07: 20 mg via ORAL
  Filled 2019-05-06 (×2): qty 1

## 2019-05-06 NOTE — Progress Notes (Signed)
Late entry: talked to Jana Half, patient's wife this afternoon and updated her about the plan of care for this patient.

## 2019-05-06 NOTE — Care Management Obs Status (Signed)
[place] NOTIFICATION   Patient Details  Name: Zachary Chase MRN: [id number] Date of Birth: 03-25-1946   Medicare Observation Status Notification Given:  Yes    Halie Gass [name], RN 05/06/2019, 1:28 PM

## 2019-05-06 NOTE — Progress Notes (Signed)
[facility] NAME: Zachary Chase    MR#:  [id number]  DATE OF BIRTH:  [birth year]  SUBJECTIVE:  CHIEF COMPLAINT:   Chief Complaint  Patient presents with  . Abdominal Pain   Came with abdominal and epigastric pain.  Still have pain.  Troponins are negative. REVIEW OF SYSTEMS:  CONSTITUTIONAL: No fever, fatigue or weakness.  EYES: No blurred or double vision.  EARS, NOSE, AND THROAT: No tinnitus or ear pain.  RESPIRATORY: No cough, shortness of breath, wheezing or hemoptysis.  CARDIOVASCULAR: No chest pain, orthopnea, edema.  GASTROINTESTINAL: No nausea, vomiting, diarrhea or abdominal pain.  GENITOURINARY: No dysuria, hematuria.  ENDOCRINE: No polyuria, nocturia,  HEMATOLOGY: No anemia, easy bruising or bleeding SKIN: No rash or lesion. MUSCULOSKELETAL: No joint pain or arthritis.   NEUROLOGIC: No tingling, numbness, weakness.  PSYCHIATRY: No anxiety or depression.   ROS  DRUG ALLERGIES:   Allergies  Allergen Reactions  . Erythromycin Nausea And Vomiting  . Sulfa Antibiotics Other (See Comments)    unknown  . Tetracyclines & Related Nausea And Vomiting    VITALS:  Blood pressure 134/62, pulse 96, temperature 99.8 F (37.7 C), temperature source Oral, resp. rate 19, height 5\' 3"  (1.6 m), weight 114.3 kg, SpO2 95 %.  PHYSICAL EXAMINATION:  GENERAL:  73 y.o.-year-old patient lying in the bed with no acute distress.  EYES: Pupils equal, round, reactive to light and accommodation. No scleral icterus. Extraocular muscles intact.  HEENT: Head atraumatic, normocephalic. Oropharynx and nasopharynx clear.  NECK:  Supple, no jugular venous distention. No thyroid enlargement, no tenderness.  LUNGS: Normal breath sounds bilaterally, no wheezing, rales,rhonchi or crepitation. No use of accessory muscles of respiration.  CARDIOVASCULAR: S1, S2 normal. No murmurs, rubs, or gallops.  ABDOMEN: Soft, nontender, nondistended. Bowel  sounds present. No organomegaly or mass.  EXTREMITIES: No pedal edema, cyanosis, or clubbing.  NEUROLOGIC: Cranial nerves II through XII are intact. Muscle strength 5/5 in all extremities. Sensation intact. Gait not checked.  PSYCHIATRIC: The patient is alert and oriented x 3.  SKIN: No obvious rash, lesion, or ulcer.   Physical Exam LABORATORY PANEL:   CBC Recent Labs  Lab 05/05/19 2155  WBC 9.0  HGB 13.7  HCT 41.0  PLT 213   ------------------------------------------------------------------------------------------------------------------  Chemistries  Recent Labs  Lab 05/05/19 2155  NA 137  K 4.6  CL 103  CO2 25  GLUCOSE 160*  BUN 19  CREATININE 1.17  CALCIUM 9.0  AST 23  ALT 24  ALKPHOS 99  BILITOT 0.5   ------------------------------------------------------------------------------------------------------------------  Cardiac Enzymes No results for input(s): TROPONINI in the last 168 hours. ------------------------------------------------------------------------------------------------------------------  RADIOLOGY:  Ct Angio Chest/abd/pel For Dissection W And/or W/wo  Result Date: 05/06/2019 CLINICAL DATA:  Chest/abdominal pain EXAM: CT ANGIOGRAPHY CHEST, ABDOMEN AND PELVIS TECHNIQUE: Multidetector CT imaging through the chest, abdomen and pelvis was performed using the standard protocol during bolus administration of intravenous contrast. Multiplanar reconstructed images and MIPs were obtained and reviewed to evaluate the vascular anatomy. CONTRAST:  121mL OMNIPAQUE IOHEXOL 350 MG/ML SOLN COMPARISON:  CT abdomen/pelvis dated 12/06/2017. CT chest dated 04/09/2008. FINDINGS: CTA CHEST FINDINGS Cardiovascular: On unenhanced CT, there is no evidence of mediastinal hematoma. Preferential opacification of the thoracic aorta. No evidence of thoracic aortic aneurysm or dissection. Atherosclerotic calcifications of the aortic arch. Although not tailored for evaluation of the  pulmonary arteries, there is no evidence of pulmonary embolism to the segmental level. The heart is normal in size.  No pericardial effusion. Mild coronary atherosclerosis of the LAD. Mediastinum/Nodes: No suspicious mediastinal lymphadenopathy. Visualized thyroid is unremarkable. Lungs/Pleura: Evaluation of the lung parenchyma is constrained by respiratory motion. Within that constraint, there are no suspicious pulmonary nodules. Mild bilateral lower lobe bronchiectasis with scarring. Mild eventration of the right hemidiaphragm with associated right basilar atelectasis. No focal consolidation. No pleural effusion or pneumothorax. Musculoskeletal: Degenerative changes of the thoracic spine. Review of the MIP images confirms the above findings. CTA ABDOMEN AND PELVIS FINDINGS VASCULAR Aorta: No evidence of abdominal aortic aneurysm or dissection. Patent. Atherosclerotic calcifications. Celiac: Patent. SMA: Patent.  Atherosclerotic calcifications proximally. Renals: Patent bilaterally. IMA: Patent. Inflow: Patent. Mild atherosclerotic calcifications of the bilateral internal iliac arteries. Veins: Unremarkable. Review of the MIP images confirms the above findings. NON-VASCULAR Hepatobiliary: No evidence of hepatic steatosis. 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 Gallbladder is unremarkable. No intrahepatic or extrahepatic ductal dilatation. Pancreas: Within normal limits. Spleen: Within normal limits. Adrenals/Urinary Tract: Adrenal glands are within normal limits. Kidneys there are within normal limits.  No hydronephrosis. Bladder is within normal limits. Stomach/Bowel: Stomach is notable for a tiny hiatal hernia. No evidence of bowel obstruction. Normal appendix (series 5/image 156). Mild left colonic diverticulosis, without evidence of diverticulitis. Lymphatic: No suspicious  abdominopelvic lymphadenopathy. Mild nonspecific fat stranding in the central jejunal mesentery, without discrete lymphadenopathy. Reproductive: Prostate is unremarkable. Other: No abdominopelvic ascites. Musculoskeletal: Degenerative changes of the lumbar spine. Review of the MIP images confirms the above findings. IMPRESSION: No evidence of thoracoabdominal aortic aneurysm or dissection. No evidence of pulmonary embolism. No CT findings to account for the patient's chest/abdominal pain. Additional ancillary findings as above. Electronically Signed   By: Julian Hy M.D.   On: 05/06/2019 01:41    ASSESSMENT AND PLAN:   Active Problems:   Chest pain  1.  Chest pain: Atypical in duration and presentation.  3 troponins are negative, seen by cardiologist and suggested no further cardiac work-up and likely noncardiac origin of the pain.  Suggested to get GI consult I spoke to GI who is suggesting previous finding of esophagitis and start on PPI twice daily and sucralfate and if patient does not improve then may need EGD.  2.  Hypertension: Intermittent control; continue lisinopril and hydrochlorothiazide 3.  Hyperlipidemia: Continue statin therapy 4.  BPH: Continue tamsulosin 5.  Asthma: Continue inhaled corticosteroid.  Albuterol as needed. 6.  DVT prophylaxis: Lovenox 7.  GI prophylaxis: PPI BID as above.    All the records are reviewed and case discussed with Care Management/Social Workerr. Management plans discussed with the patient, family and they are in agreement.  CODE STATUS: Full.  TOTAL TIME TAKING CARE OF THIS PATIENT: 35 minutes.     POSSIBLE D/C IN 1-2 DAYS, DEPENDING ON CLINICAL CONDITION.   Vaughan Basta M.D on 05/06/2019   Between 7am to 6pm - Pager - [phone]  After 6pm go to [email or web address] - password [email or web address] [facility]  Office  [phone]  CC: Primary care physician; Juluis Pitch, MD  Note: This dictation was  prepared with Dragon dictation along with smaller phrase technology. Any transcriptional errors that result from this process are unintentional.

## 2019-05-06 NOTE — ED Provider Notes (Signed)
[facility] Emergency Department Provider Note  ____________________________________________   First MD Initiated Contact with Patient 05/06/19 0013     (approximate)  I have reviewed the triage vital signs and the nursing notes.   HISTORY  Chief Complaint Abdominal Pain    HPI Zachary Chase is a 73 y.o. male with medical history as listed below who presents for evaluation of acute onset and severe pain in the middle of his upper abdomen.  He reports that it started about 5:30 PM (approximately 7 hours ago) and has been constant and gradually worsening over time.  He reports that it is aching and throbbing and severe and nothing in particular makes it better or worse.  He is not sure if maybe he was hungry and try to eat something but he did not feel better although trying to eat did not make him feel worse either.  He has had no nausea nor vomiting.  He denies chest pain.  He has not been around anyone known to have COVID-19.  He has not had any diarrhea or constipation.  He says that he had an ulcer years ago and it was in a similar location but this pain is worse and he takes an antacid every night (Pepcid).  The pain started acutely and he cannot identify reason for why it may have begun.  He has no history of aortic aneurysm of which he is aware of.  He is not having any numbness nor weakness in his extremities.  He reports that he does take blood pressure medicine but his blood pressure is much higher tonight than usual.  He is anxious and keeps saying "something is wrong, something is wrong".         Past Medical History:  Diagnosis Date   Anemia    Arthritis    Asthma    Back pain    BPH (benign prostatic hyperplasia)    Dermatophytosis of nail    Enthesopathy of hip region    Gastric ulcer    H/O multiple allergies    History of colonic polyps    Hx of onychia and paronychia    Hypersomnia    Hypertension    Lower  extremity edema    Pneumonia    Prostatic hypertrophy    Pure hypercholesterolemia    Sleep apnea    Testicular hypofunction    Tremor    of the head   Wheezing     There are no active problems to display for this patient.   Past Surgical History:  Procedure Laterality Date   CATARACT EXTRACTION W/PHACO Right 09/01/2016   Procedure: CATARACT EXTRACTION PHACO AND INTRAOCULAR LENS PLACEMENT (IOC);  Surgeon: Birder Robson, MD;  Location: [facility] ORS;  Service: Ophthalmology;  Laterality: Right;  Lot# [id number] H [place]: 00:34.0 AP%:21.8 CDE: 7.39    COLONOSCOPY WITH PROPOFOL N/A 11/30/2018   Procedure: COLONOSCOPY WITH PROPOFOL;  Surgeon: Manya Silvas, MD;  Location: [facility] ENDOSCOPY;  Service: Endoscopy;  Laterality: N/A;   ESOPHAGOGASTRODUODENOSCOPY (EGD) WITH PROPOFOL N/A 11/30/2018   Procedure: ESOPHAGOGASTRODUODENOSCOPY (EGD) WITH PROPOFOL;  Surgeon: Manya Silvas, MD;  Location: [facility] ENDOSCOPY;  Service: Endoscopy;  Laterality: N/A;   sleep apnea surgery     TONSILLECTOMY      Prior to Admission medications   Medication Sig Start Date End Date Taking? Authorizing Provider  acetaminophen (TYLENOL) 500 MG tablet Take 500 mg by mouth every 6 (six) hours as needed.    [provider]  albuterol (PROVENTIL HFA;VENTOLIN HFA) 108 (90 Base) MCG/ACT inhaler Inhale 1 puff into the lungs every 4 (four) hours as needed for wheezing or shortness of breath.    [provider]  augmented betamethasone dipropionate (DIPROLENE-AF) 0.05 % ointment Apply 1 application topically 2 (two) times daily. 08/23/16   [provider]  betamethasone dipropionate (DIPROLENE) 0.05 % cream Apply 1 Dose topically 2 (two) times daily.    [provider]  budesonide-formoterol (SYMBICORT) 80-4.5 MCG/ACT inhaler Inhale 2 puffs into the lungs 2 (two) times daily.    [provider]  celecoxib (CELEBREX) 200 MG capsule Take 200 mg by mouth daily.    [provider]  docusate sodium (COLACE) 100 MG capsule Take 200 mg by mouth at bedtime.    [provider]  fexofenadine (ALLEGRA) 180 MG tablet Take 180 mg by mouth daily.    [provider]  finasteride (PROSCAR) 5 MG tablet Take 5 mg by mouth daily.    [provider]  fluticasone (FLONASE) 50 MCG/ACT nasal spray Place 1 spray into both nostrils daily as needed for allergies or rhinitis.    [provider]  glucosamine-chondroitin 500-400 MG tablet Take 1 tablet by mouth 3 (three) times daily.    [provider]  HYDROcodone-acetaminophen (NORCO) 5-325 MG tablet Take 1 tablet by mouth every 6 (six) hours as needed for severe pain. 12/06/17   Arta Silence, MD  hydrocortisone cream 1 % Apply 1 application topically 2 (two) times daily.    [provider]  ketotifen (ALAWAY) 0.025 % ophthalmic solution Place 1 drop into both eyes 2 (two) times daily as needed.    [provider]  lisinopril-hydrochlorothiazide (PRINZIDE,ZESTORETIC) 20-12.5 MG tablet Take 0.5 tablets by mouth daily.    [provider]  Melatonin 10 MG TABS Take 1 tablet by mouth at bedtime.    [provider]  Multiple Vitamins-Minerals (HAIR SKIN AND NAILS FORMULA) TABS Take 1 tablet by mouth daily.    [provider]  naproxen sodium (ALEVE) 220 MG tablet Take 220 mg by mouth.    [provider]  tamsulosin (FLOMAX) 0.4 MG CAPS capsule Take 0.4 mg by mouth 2 (two) times daily.    [provider]    Allergies Erythromycin, Sulfa antibiotics, and Tetracyclines & related  No family history on file.  Social History Social History   Tobacco Use   Smoking status: Former Smoker   Smokeless tobacco: Never Used  Substance Use Topics   Alcohol use: Yes    Comment: occasional none last 24hrs   Drug use: Not Currently    Review of Systems Constitutional: No fever/chills Eyes: No visual changes. ENT: No sore  throat. Cardiovascular: Denies chest pain. Respiratory: Denies shortness of breath. Gastrointestinal: Upper abdominal pain as described above with no nausea, vomiting, nor diarrhea. Genitourinary: Negative for dysuria. Musculoskeletal: Negative for neck pain.  Negative for back pain. Integumentary: Negative for rash. Neurological: Negative for headaches, focal weakness or numbness.   ____________________________________________   PHYSICAL EXAM:  VITAL SIGNS: ED Triage Vitals  Enc Vitals Group     BP 05/05/19 2128 (!) 170/78     Pulse Rate 05/05/19 2128 78     Resp 05/05/19 2128 16     Temp 05/05/19 2128 98.4 F (36.9 C)     Temp Source 05/05/19 2128 Oral     SpO2 05/05/19 2128 100 %     Weight 05/05/19 2129 114.3 kg (252 lb)  Height 05/05/19 2129 1.6 m (5\' 3" )     Head Circumference --      Peak Flow --      Pain Score 05/05/19 2131 7     Pain Loc --      Pain Edu? --      Excl. in [place]? --     Constitutional: Alert and oriented.  Nontoxic appearance but does appear very uncomfortable currently. Eyes: Conjunctivae are normal.  Head: Atraumatic. Nose: No congestion/rhinnorhea. Mouth/Throat: Mucous membranes are moist. Neck: No stridor.  No meningeal signs.   Cardiovascular: Normal rate, regular rhythm. Good peripheral circulation. Grossly normal heart sounds. Respiratory: Normal respiratory effort.  No retractions. No audible wheezing. Gastrointestinal: Obese, probable abdominal wall hernia, no bruit, severe tenderness to palpation of the upper abdomen with no rebound or guarding, no lower abdominal tenderness except for some tenderness to the right lateral aspect of his lower abdomen. Musculoskeletal: No lower extremity tenderness nor edema. No gross deformities of extremities. Neurologic:  Normal speech and language. No gross focal neurologic deficits are appreciated.  Skin:  Skin is warm, dry and intact. No rash noted. Psychiatric: Mood and affect are anxious but  generally appropriate under the circumstances.  ____________________________________________   LABS (all labs ordered are listed, but only abnormal results are displayed)  Labs Reviewed  COMPREHENSIVE METABOLIC PANEL - Abnormal; Notable for the following components:      Result Value   Glucose, Bld 160 (*)    All other components within normal limits  URINALYSIS, COMPLETE (UACMP) WITH MICROSCOPIC - Abnormal; Notable for the following components:   Color, Urine YELLOW (*)    APPearance CLEAR (*)    All other components within normal limits  SARS CORONAVIRUS 2 (HOSPITAL ORDER, [place] [facility])  LIPASE, BLOOD  CBC  TROPONIN I (HIGH SENSITIVITY)  TROPONIN I (HIGH SENSITIVITY)   ____________________________________________  EKG  ED ECG REPORT I, Hinda Kehr, the attending physician, personally viewed and interpreted this ECG.  Date: 05/05/2019 EKG Time: 21: 35 Rate: 75 Rhythm: normal sinus rhythm QRS Axis: normal Intervals: Right bundle branch block ST/T Wave abnormalities: Non-specific ST segment / T-wave changes, but no clear evidence of acute ischemia. Narrative Interpretation: no definitive evidence of acute ischemia; does not meet STEMI criteria.   ____________________________________________  RADIOLOGY   ED MD interpretation:  No evidence of acute abnormality  Official radiology report(s): Ct Angio Chest/abd/pel For Dissection W And/or W/wo  Result Date: 05/06/2019 CLINICAL DATA:  Chest/abdominal pain EXAM: CT ANGIOGRAPHY CHEST, ABDOMEN AND PELVIS TECHNIQUE: Multidetector CT imaging through the chest, abdomen and pelvis was performed using the standard protocol during bolus administration of intravenous contrast. Multiplanar reconstructed images and MIPs were obtained and reviewed to evaluate the vascular anatomy. CONTRAST:  142mL OMNIPAQUE IOHEXOL 350 MG/ML SOLN COMPARISON:  CT abdomen/pelvis dated 12/06/2017. CT chest dated 04/09/2008.  FINDINGS: CTA CHEST FINDINGS Cardiovascular: On unenhanced CT, there is no evidence of mediastinal hematoma. Preferential opacification of the thoracic aorta. No evidence of thoracic aortic aneurysm or dissection. Atherosclerotic calcifications of the aortic arch. Although not tailored for evaluation of the pulmonary arteries, there is no evidence of pulmonary embolism to the segmental level. The heart is normal in size.  No pericardial effusion. Mild coronary atherosclerosis of the LAD. Mediastinum/Nodes: No suspicious mediastinal lymphadenopathy. Visualized thyroid is unremarkable. Lungs/Pleura: Evaluation of the lung parenchyma is constrained by respiratory motion. Within that constraint, there are no suspicious pulmonary nodules. Mild bilateral lower lobe bronchiectasis with scarring. Mild eventration of  the right hemidiaphragm with associated right basilar atelectasis. No focal consolidation. No pleural effusion or pneumothorax. Musculoskeletal: Degenerative changes of the thoracic spine. Review of the MIP images confirms the above findings. CTA ABDOMEN AND PELVIS FINDINGS VASCULAR Aorta: No evidence of abdominal aortic aneurysm or dissection. Patent. Atherosclerotic calcifications. Celiac: Patent. SMA: Patent.  Atherosclerotic calcifications proximally. Renals: Patent bilaterally. IMA: Patent. Inflow: Patent. Mild atherosclerotic calcifications of the bilateral internal iliac arteries. Veins: Unremarkable. Review of the MIP images confirms the above findings. NON-VASCULAR Hepatobiliary: No evidence of hepatic steatosis. 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 Gallbladder is unremarkable. No intrahepatic or extrahepatic ductal dilatation. Pancreas: Within normal limits. Spleen: Within normal limits. Adrenals/Urinary Tract: Adrenal glands are within normal limits. Kidneys there are  within normal limits.  No hydronephrosis. Bladder is within normal limits. Stomach/Bowel: Stomach is notable for a tiny hiatal hernia. No evidence of bowel obstruction. Normal appendix (series 5/image 156). Mild left colonic diverticulosis, without evidence of diverticulitis. Lymphatic: No suspicious abdominopelvic lymphadenopathy. Mild nonspecific fat stranding in the central jejunal mesentery, without discrete lymphadenopathy. Reproductive: Prostate is unremarkable. Other: No abdominopelvic ascites. Musculoskeletal: Degenerative changes of the lumbar spine. Review of the MIP images confirms the above findings. IMPRESSION: No evidence of thoracoabdominal aortic aneurysm or dissection. No evidence of pulmonary embolism. No CT findings to account for the patient's chest/abdominal pain. Additional ancillary findings as above. Electronically Signed   By: Julian Hy M.D.   On: 05/06/2019 01:41    ____________________________________________   PROCEDURES   Procedure(s) performed (including Critical Care):  Procedures   ____________________________________________   INITIAL IMPRESSION / MDM / [place] / ED COURSE  As part of my medical decision making, I reviewed the following data within the [place] notes reviewed and incorporated, Labs reviewed , EKG interpreted , Old chart reviewed, Discussed with admitting physician (Dr. Marcille Blanco) and Notes from prior ED visits   Differential diagnosis includes, but is not limited to, AAA, aortic dissection, acid reflux, pancreatitis, biliary disease, ACS, less likely PE or pneumonia.  No risk factors for COVID-19 and no respiratory symptoms.  Patient is acutely uncomfortable, anxious, keeps stating that he feels like something is wrong.  He does not have a history of frequent emergency department visits.  The most concerning and life-threatening possibility of a can consider right now is that of AAA or aortic  dissection.  His blood pressure is significantly elevated over his baseline and while this may be the result of his pain and anxiety over the pain, it is concerning and of itself and a risk factor for aortic pathology.   His comprehensive metabolic panel is within normal limits including his kidney function.  Urinalysis is normal.  Lipase is normal.  I have added on a high-sensitivity troponin.  No indication of ischemia on EKG.  I am ordering morphine 4 mg IV, Zofran 4 mg IV, have changed the patient into a gown, and have ordered a CTA chest/abdomen/pelvis to rule out aortic dissection.  Patient understands and agrees with the plan.       Clinical Course as of May 06 451  Sat May 06, 2019  0126 Troponin I (High Sensitivity): 5 [CF]  0303 I reassessed the patient and he is feeling better but still is having some upper abdominal discomfort.  I have ordered a GI cocktail with viscous lidocaine as well as a gram of  Carafate by mouth.  He is comfortable trying this and then I will reassess.   [CF]  [phone] Patient feels better but still not normal.  While dozing in and out of sleep, his heart rate remains in the lower 100s (about 105 +/- 5).   Of note, his HS-troponin is 10 and his first one was 5.  According to the HS-troponin protocol, a delta of 5 indicates the need for observation/admission.  Given his ongoing discomfort, persistent tachycardia, and lack of alternative diagnosis, the patient agrees to stay in the hospital.  I secure chat texted Dr. Marcille Blanco to discuss the admission.   [CF]  [phone] Discussed case by phone with Dr. Marcille Blanco who will admit.   [CF]  [phone] Of note, I am ordering the rapid coronavirus swab in case he needs an acute cardiology intervention   [CF]    Clinical Course User Index [CF] Hinda Kehr, MD     ____________________________________________  FINAL CLINICAL IMPRESSION(S) / ED DIAGNOSES  Final diagnoses:  Upper abdominal pain  Sinus tachycardia  Elevated  troponin level     MEDICATIONS GIVEN DURING THIS VISIT:  Medications  pantoprazole (PROTONIX) injection 40 mg (has no administration in time range)  morphine 4 MG/ML injection 4 mg (4 mg Intravenous Given 05/06/19 0036)  ondansetron (ZOFRAN) injection 4 mg (4 mg Intravenous Given 05/06/19 0036)  iohexol (OMNIPAQUE) 350 MG/ML injection 125 mL (125 mLs Intravenous Contrast Given 05/06/19 0104)  sucralfate (CARAFATE) tablet 1 g (1 g Oral Given 05/06/19 0320)  alum & mag hydroxide-simeth (MAALOX/MYLANTA) 200-200-20 MG/5ML suspension 30 mL (30 mLs Oral Given 05/06/19 0320)    And  lidocaine (XYLOCAINE) 2 % viscous mouth solution 15 mL (15 mLs Oral Given 05/06/19 0320)     ED Discharge Orders    None      *Please note:  Zachary Chase was evaluated in Emergency Department on 05/06/2019 for the symptoms described in the history of present illness. He was evaluated in the context of the global COVID-19 pandemic, which necessitated consideration that the patient might be at risk for infection with the SARS-CoV-2 virus that causes COVID-19. Institutional protocols and algorithms that pertain to the evaluation of patients at risk for COVID-19 are in a state of rapid change based on information released by regulatory bodies including the CDC and federal and state organizations. These policies and algorithms were followed during the patient's care in the ED.  Some ED evaluations and interventions may be delayed as a result of limited staffing during the pandemic.*  Note:  This document was prepared using Dragon voice recognition software and may include unintentional dictation errors.   Hinda Kehr, MD 05/06/19 [phone]

## 2019-05-06 NOTE — Progress Notes (Signed)
Talked to Dr. Jannifer Franklin about patient's spiking a temperature tonight, also is tachypneic and tachycardic at 109, he had a low grade temp at 100.7 at [name], gave tylenol at 2037 and also gave him tepid sponge bath. I recheck his temperature again and it was 101.2, order given for ibuprofen to treat temperature. Also MD place an order to re-swab patient for covid and to place an order for airborne and contact precaution. Also patient has been somnolent throughout the shift where at times he drifts back to sleep during conversation, order place for ABG. Patient has a history of sleep apnea but does not wear CPAP at home. Called wife Zachary Chase and updated her about the situation.

## 2019-05-06 NOTE — ED Notes (Signed)
ED TO INPATIENT HANDOFF REPORT  ED Nurse Name and Phone #: Wells Guiles 52  S Name/Age/Gender Zachary Chase 73 y.o. male Room/Bed: ED26A/ED26A  Code Status   Code Status: Not on file  Home/SNF/Other Home Patient oriented to: self, place, time and situation Is this baseline? Yes   Triage Complete: Triage complete  Chief Complaint Abd Pain  Triage Note Patient c/o mid abdominal pain beginning at 1730. Patient denies any accompanying symptoms.    Allergies Allergies  Allergen Reactions  . Erythromycin Nausea And Vomiting  . Sulfa Antibiotics   . Tetracyclines & Related     Level of Care/Admitting Diagnosis ED Disposition    ED Disposition Condition [facility]: [place] [[id number]]  Level of Care: Telemetry [5]  Covid Evaluation: Asymptomatic Screening Protocol (No Symptoms)  Diagnosis: Chest pain [370488]  Admitting Physician: Harrie Foreman [[id number]]  Attending Physician: Harrie Foreman [phone]  PT Class (Do Not Modify): Observation [104]  PT Acc Code (Do Not Modify): Observation [10022]       B Medical/Surgery History Past Medical History:  Diagnosis Date  . Anemia   . Arthritis   . Asthma   . Back pain   . BPH (benign prostatic hyperplasia)   . Dermatophytosis of nail   . Enthesopathy of hip region   . Gastric ulcer   . H/O multiple allergies   . History of colonic polyps   . Hx of onychia and paronychia   . Hypersomnia   . Hypertension   . Lower extremity edema   . Pneumonia   . Prostatic hypertrophy   . Pure hypercholesterolemia   . Sleep apnea   . Testicular hypofunction   . Tremor    of the head  . Wheezing    Past Surgical History:  Procedure Laterality Date  . CATARACT EXTRACTION W/PHACO Right 09/01/2016   Procedure: CATARACT EXTRACTION PHACO AND INTRAOCULAR LENS PLACEMENT (IOC);  Surgeon: Birder Robson, MD;  Location: [facility] ORS;  Service: Ophthalmology;  Laterality: Right;   Lot# [id number] H [place]: 00:34.0 AP%:21.8 CDE: 7.39   . COLONOSCOPY WITH PROPOFOL N/A 11/30/2018   Procedure: COLONOSCOPY WITH PROPOFOL;  Surgeon: Manya Silvas, MD;  Location: [facility] ENDOSCOPY;  Service: Endoscopy;  Laterality: N/A;  . ESOPHAGOGASTRODUODENOSCOPY (EGD) WITH PROPOFOL N/A 11/30/2018   Procedure: ESOPHAGOGASTRODUODENOSCOPY (EGD) WITH PROPOFOL;  Surgeon: Manya Silvas, MD;  Location: [facility] ENDOSCOPY;  Service: Endoscopy;  Laterality: N/A;  . sleep apnea surgery    . TONSILLECTOMY       A IV Location/Drains/Wounds Patient Lines/Drains/Airways Status   Active Line/Drains/Airways    Name:   Placement date:   Placement time:   Site:   Days:   Peripheral IV 05/06/19 Right Antecubital   05/06/19    0040    Antecubital   less than 1   Airway   11/30/18    0815     157   Incision (Closed) 09/01/16 Eye Right   09/01/16    1125     977          Intake/Output Last 24 hours No intake or output data in the 24 hours ending 05/06/19 0505  Labs/Imaging Results for orders placed or performed during the hospital encounter of 05/06/19 (from the past 48 hour(s))  Lipase, blood     Status: None   Collection Time: 05/05/19  9:55 PM  Result Value Ref Range   Lipase 36 11 - 51 U/L    Comment: Performed  at [facility], [address] [place], [place] [address]  Comprehensive metabolic panel     Status: Abnormal   Collection Time: 05/05/19  9:55 PM  Result Value Ref Range   Sodium 137 135 - 145 mmol/L   Potassium 4.6 3.5 - 5.1 mmol/L   Chloride 103 98 - 111 mmol/L   CO2 25 22 - 32 mmol/L   Glucose, Bld 160 (H) 70 - 99 mg/dL   BUN 19 8 - 23 mg/dL   Creatinine, Ser 1.17 0.61 - 1.24 mg/dL   Calcium 9.0 8.9 - 10.3 mg/dL   Total Protein 7.9 6.5 - 8.1 g/dL   Albumin 4.3 3.5 - 5.0 g/dL   AST 23 15 - 41 U/L   ALT 24 0 - 44 U/L   Alkaline Phosphatase 99 38 - 126 U/L   Total Bilirubin 0.5 0.3 - 1.2 mg/dL   GFR calc non Af Amer >60 >60 mL/min   GFR calc Af Amer >60 >60 mL/min    Anion gap 9 5 - 15    Comment: Performed at [facility], [address] [place], [place] [address]  CBC     Status: None   Collection Time: 05/05/19  9:55 PM  Result Value Ref Range   WBC 9.0 4.0 - 10.5 K/uL   RBC 4.50 4.22 - 5.81 MIL/uL   Hemoglobin 13.7 13.0 - 17.0 g/dL   HCT 41.0 39.0 - 52.0 %   MCV 91.1 80.0 - 100.0 fL   MCH 30.4 26.0 - 34.0 pg   MCHC 33.4 30.0 - 36.0 g/dL   RDW 13.9 11.5 - 15.5 %   Platelets 213 150 - 400 K/uL   nRBC 0.0 0.0 - 0.2 %    Comment: Performed at [facility], [place], [place] [address]  Troponin I (High Sensitivity)     Status: None   Collection Time: 05/05/19  9:55 PM  Result Value Ref Range   Troponin I (High Sensitivity) 5 <18 ng/L    Comment: (NOTE) Elevated high sensitivity troponin I (hsTnI) values and significant  changes across serial measurements may suggest ACS but many other  chronic and acute conditions are known to elevate hsTnI results.  Refer to the "Links" section for chest pain algorithms and additional  guidance. Performed at [facility], [address] [place], [place] [address]   Urinalysis, Complete w Microscopic     Status: Abnormal   Collection Time: 05/05/19  9:57 PM  Result Value Ref Range   Color, Urine YELLOW (A) YELLOW   APPearance CLEAR (A) CLEAR   Specific Gravity, Urine 1.015 1.005 - 1.030   pH 6.0 5.0 - 8.0   Glucose, UA NEGATIVE NEGATIVE mg/dL   Hgb urine dipstick NEGATIVE NEGATIVE   Bilirubin Urine NEGATIVE NEGATIVE   Ketones, ur NEGATIVE NEGATIVE mg/dL   Protein, ur NEGATIVE NEGATIVE mg/dL   Nitrite NEGATIVE NEGATIVE   Leukocytes,Ua NEGATIVE NEGATIVE   RBC / HPF 0-5 0 - 5 RBC/hpf   WBC, UA 0-5 0 - 5 WBC/hpf   Bacteria, UA NONE SEEN NONE SEEN   Squamous Epithelial / LPF 0-5 0 - 5   Mucus PRESENT     Comment: Performed at [facility], [address] [place], [place] [address]  Troponin I (High Sensitivity)     Status: None   Collection  Time: 05/06/19  3:21 AM  Result Value Ref Range   Troponin I (High Sensitivity) 10 <18 ng/L  Comment: (NOTE) Elevated high sensitivity troponin I (hsTnI) values and significant  changes across serial measurements may suggest ACS but many other  chronic and acute conditions are known to elevate hsTnI results.  Refer to the "Links" section for chest pain algorithms and additional  guidance. Performed at [facility], [place], [place] [address]    Ct Angio Chest/abd/pel For Dissection W And/or W/wo  Result Date: 05/06/2019 CLINICAL DATA:  Chest/abdominal pain EXAM: CT ANGIOGRAPHY CHEST, ABDOMEN AND PELVIS TECHNIQUE: Multidetector CT imaging through the chest, abdomen and pelvis was performed using the standard protocol during bolus administration of intravenous contrast. Multiplanar reconstructed images and MIPs were obtained and reviewed to evaluate the vascular anatomy. CONTRAST:  134mL OMNIPAQUE IOHEXOL 350 MG/ML SOLN COMPARISON:  CT abdomen/pelvis dated 12/06/2017. CT chest dated 04/09/2008. FINDINGS: CTA CHEST FINDINGS Cardiovascular: On unenhanced CT, there is no evidence of mediastinal hematoma. Preferential opacification of the thoracic aorta. No evidence of thoracic aortic aneurysm or dissection. Atherosclerotic calcifications of the aortic arch. Although not tailored for evaluation of the pulmonary arteries, there is no evidence of pulmonary embolism to the segmental level. The heart is normal in size.  No pericardial effusion. Mild coronary atherosclerosis of the LAD. Mediastinum/Nodes: No suspicious mediastinal lymphadenopathy. Visualized thyroid is unremarkable. Lungs/Pleura: Evaluation of the lung parenchyma is constrained by respiratory motion. Within that constraint, there are no suspicious pulmonary nodules. Mild bilateral lower lobe bronchiectasis with scarring. Mild eventration of the right hemidiaphragm with associated right basilar atelectasis. No focal  consolidation. No pleural effusion or pneumothorax. Musculoskeletal: Degenerative changes of the thoracic spine. Review of the MIP images confirms the above findings. CTA ABDOMEN AND PELVIS FINDINGS VASCULAR Aorta: No evidence of abdominal aortic aneurysm or dissection. Patent. Atherosclerotic calcifications. Celiac: Patent. SMA: Patent.  Atherosclerotic calcifications proximally. Renals: Patent bilaterally. IMA: Patent. Inflow: Patent. Mild atherosclerotic calcifications of the bilateral internal iliac arteries. Veins: Unremarkable. Review of the MIP images confirms the above findings. NON-VASCULAR Hepatobiliary: No evidence of hepatic steatosis. 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 Gallbladder is unremarkable. No intrahepatic or extrahepatic ductal dilatation. Pancreas: Within normal limits. Spleen: Within normal limits. Adrenals/Urinary Tract: Adrenal glands are within normal limits. Kidneys there are within normal limits.  No hydronephrosis. Bladder is within normal limits. Stomach/Bowel: Stomach is notable for a tiny hiatal hernia. No evidence of bowel obstruction. Normal appendix (series 5/image 156). Mild left colonic diverticulosis, without evidence of diverticulitis. Lymphatic: No suspicious abdominopelvic lymphadenopathy. Mild nonspecific fat stranding in the central jejunal mesentery, without discrete lymphadenopathy. Reproductive: Prostate is unremarkable. Other: No abdominopelvic ascites. Musculoskeletal: Degenerative changes of the lumbar spine. Review of the MIP images confirms the above findings. IMPRESSION: No evidence of thoracoabdominal aortic aneurysm or dissection. No evidence of pulmonary embolism. No CT findings to account for the patient's chest/abdominal pain. Additional ancillary findings as above. Electronically Signed   By: Julian Hy M.D.   On:  05/06/2019 01:41    Pending Labs Unresulted Labs (From admission, onward)    Start     Ordered   05/06/19 0452  SARS Coronavirus 2 (CEPHEID - Performed in [place] hospital lab), [place]  (Asymptomatic Patients Labs)  Once,   STAT    Question:  Rule Out  Answer:  Yes   05/06/19 0452   Signed and Held  Creatinine, serum  (enoxaparin (LOVENOX)    CrCl >/= 30 ml/min)  Weekly,  R    Comments: while on enoxaparin therapy    Signed and Held   Signed and Held  TSH  Add-on,   R     Signed and Held          Vitals/Pain Today's Vitals   05/06/19 0014 05/06/19 0015 05/06/19 0016 05/06/19 0319  BP: (!) 176/81     Pulse:   78   Resp: 18 (!) 24    Temp:      TempSrc:      SpO2:   98%   Weight:      Height:      PainSc:    2     Isolation Precautions No active isolations  Medications Medications  morphine 4 MG/ML injection 4 mg (4 mg Intravenous Given 05/06/19 0036)  ondansetron (ZOFRAN) injection 4 mg (4 mg Intravenous Given 05/06/19 0036)  iohexol (OMNIPAQUE) 350 MG/ML injection 125 mL (125 mLs Intravenous Contrast Given 05/06/19 0104)  sucralfate (CARAFATE) tablet 1 g (1 g Oral Given 05/06/19 0320)  alum & mag hydroxide-simeth (MAALOX/MYLANTA) 200-200-20 MG/5ML suspension 30 mL (30 mLs Oral Given 05/06/19 0320)    And  lidocaine (XYLOCAINE) 2 % viscous mouth solution 15 mL (15 mLs Oral Given 05/06/19 0320)  pantoprazole (PROTONIX) injection 40 mg (40 mg Intravenous Given 05/06/19 0457)    Mobility walks Low fall risk   Focused Assessments Cardiac Assessment Handoff:    No results found for: CKTOTAL, CKMB, CKMBINDEX, TROPONINI No results found for: DDIMER Does the Patient currently have chest pain? No      R Recommendations: See Admitting Provider Note  Report given to:   Additional Notes:

## 2019-05-06 NOTE — Consult Note (Signed)
Reason for Consult: Abdominal pain chest pain Referring Physician: Dr. Providence Crosby primary Dr. Rosilyn Mings hospitalist  [facility] Zachary Chase is an 73 y.o. male.  HPI: History of gastric ulcers hypertension hyperlipidemia asthma obstructive sleep apnea BPH with obesity.  Started having upper abdominal epigastric discomfort which gradually got worse she came in for further assessment there was concern in emergency room that there will be some chest pain but the patient mainly had abdominal discomfort no nausea vomiting or diarrhea no fever chills or sweats patient high-sensitivity troponins were negative patient had a slightly elevated rate of rise on a troponin but it was still within lower limits of normal patient still has abdominal discomfort has had colonoscopies but no recent EGD has had surgery for obstructive sleep apnea in the past now here for further cardiac assessment denies any previous cardiac history  Past Medical History:  Diagnosis Date  . Anemia   . Arthritis   . Asthma   . Back pain   . BPH (benign prostatic hyperplasia)   . Dermatophytosis of nail   . Enthesopathy of hip region   . Gastric ulcer   . H/O multiple allergies   . History of colonic polyps   . Hx of onychia and paronychia   . Hypersomnia   . Hypertension   . Lower extremity edema   . Pneumonia   . Prostatic hypertrophy   . Pure hypercholesterolemia   . Sleep apnea   . Testicular hypofunction   . Tremor    of the head  . Wheezing     Past Surgical History:  Procedure Laterality Date  . CATARACT EXTRACTION W/PHACO Right 09/01/2016   Procedure: CATARACT EXTRACTION PHACO AND INTRAOCULAR LENS PLACEMENT (IOC);  Surgeon: Birder Robson, MD;  Location: [facility] ORS;  Service: Ophthalmology;  Laterality: Right;  Lot# [id number] H [place]: 00:34.0 AP%:21.8 CDE: 7.39   . COLONOSCOPY WITH PROPOFOL N/A 11/30/2018   Procedure: COLONOSCOPY WITH PROPOFOL;  Surgeon: Manya Silvas, MD;  Location: [facility] ENDOSCOPY;   Service: Endoscopy;  Laterality: N/A;  . ESOPHAGOGASTRODUODENOSCOPY (EGD) WITH PROPOFOL N/A 11/30/2018   Procedure: ESOPHAGOGASTRODUODENOSCOPY (EGD) WITH PROPOFOL;  Surgeon: Manya Silvas, MD;  Location: [facility] ENDOSCOPY;  Service: Endoscopy;  Laterality: N/A;  . sleep apnea surgery    . TONSILLECTOMY      No family history on file.  Social History:  reports that he has quit smoking. He has never used smokeless tobacco. He reports current alcohol use. He reports previous drug use.  Allergies:  Allergies  Allergen Reactions  . Erythromycin Nausea And Vomiting  . Sulfa Antibiotics Other (See Comments)    unknown  . Tetracyclines & Related Nausea And Vomiting    Medications: I have reviewed the patient's current medications.  Results for orders placed or performed during the hospital encounter of 05/06/19 (from the past 48 hour(s))  Lipase, blood     Status: None   Collection Time: 05/05/19  9:55 PM  Result Value Ref Range   Lipase 36 11 - 51 U/L    Comment: Performed at [facility], [address] [place], [place] [address]  Comprehensive metabolic panel     Status: Abnormal   Collection Time: 05/05/19  9:55 PM  Result Value Ref Range   Sodium 137 135 - 145 mmol/L   Potassium 4.6 3.5 - 5.1 mmol/L   Chloride 103 98 - 111 mmol/L   CO2 25 22 - 32 mmol/L   Glucose, Bld 160 (H) 70 - 99 mg/dL   BUN  19 8 - 23 mg/dL   Creatinine, Ser 1.17 0.61 - 1.24 mg/dL   Calcium 9.0 8.9 - 10.3 mg/dL   Total Protein 7.9 6.5 - 8.1 g/dL   Albumin 4.3 3.5 - 5.0 g/dL   AST 23 15 - 41 U/L   ALT 24 0 - 44 U/L   Alkaline Phosphatase 99 38 - 126 U/L   Total Bilirubin 0.5 0.3 - 1.2 mg/dL   GFR calc non Af Amer >60 >60 mL/min   GFR calc Af Amer >60 >60 mL/min   Anion gap 9 5 - 15    Comment: Performed at [facility], [address] [place], [place] [address]  CBC     Status: None   Collection Time: 05/05/19  9:55 PM  Result Value Ref Range   WBC 9.0 4.0 - 10.5 K/uL   RBC  4.50 4.22 - 5.81 MIL/uL   Hemoglobin 13.7 13.0 - 17.0 g/dL   HCT 41.0 39.0 - 52.0 %   MCV 91.1 80.0 - 100.0 fL   MCH 30.4 26.0 - 34.0 pg   MCHC 33.4 30.0 - 36.0 g/dL   RDW 13.9 11.5 - 15.5 %   Platelets 213 150 - 400 K/uL   nRBC 0.0 0.0 - 0.2 %    Comment: Performed at [facility], [place], [place] [address]  Troponin I (High Sensitivity)     Status: None   Collection Time: 05/05/19  9:55 PM  Result Value Ref Range   Troponin I (High Sensitivity) 5 <18 ng/L    Comment: (NOTE) Elevated high sensitivity troponin I (hsTnI) values and significant  changes across serial measurements may suggest ACS but many other  chronic and acute conditions are known to elevate hsTnI results.  Refer to the "Links" section for chest pain algorithms and additional  guidance. Performed at [facility], [address] [place], [place] [address]   Urinalysis, Complete w Microscopic     Status: Abnormal   Collection Time: 05/05/19  9:57 PM  Result Value Ref Range   Color, Urine YELLOW (A) YELLOW   APPearance CLEAR (A) CLEAR   Specific Gravity, Urine 1.015 1.005 - 1.030   pH 6.0 5.0 - 8.0   Glucose, UA NEGATIVE NEGATIVE mg/dL   Hgb urine dipstick NEGATIVE NEGATIVE   Bilirubin Urine NEGATIVE NEGATIVE   Ketones, ur NEGATIVE NEGATIVE mg/dL   Protein, ur NEGATIVE NEGATIVE mg/dL   Nitrite NEGATIVE NEGATIVE   Leukocytes,Ua NEGATIVE NEGATIVE   RBC / HPF 0-5 0 - 5 RBC/hpf   WBC, UA 0-5 0 - 5 WBC/hpf   Bacteria, UA NONE SEEN NONE SEEN   Squamous Epithelial / LPF 0-5 0 - 5   Mucus PRESENT     Comment: Performed at [facility], [place], [place] [address]  Troponin I (High Sensitivity)     Status: None   Collection Time: 05/06/19  3:21 AM  Result Value Ref Range   Troponin I (High Sensitivity) 10 <18 ng/L    Comment: (NOTE) Elevated high sensitivity troponin I (hsTnI) values and significant  changes across serial measurements may suggest ACS  but many other  chronic and acute conditions are known to elevate hsTnI results.  Refer to the "Links" section for chest pain algorithms and additional  guidance. Performed at [facility] [address], [place], [place] [address]   SARS Coronavirus 2 (CEPHEID - Performed in [facility] hospital lab), [facility]  Status: None   Collection Time: 05/06/19  4:58 AM   Specimen: Nasopharyngeal Swab  Result Value Ref Range   SARS Coronavirus 2 NEGATIVE NEGATIVE    Comment: (NOTE) If result is NEGATIVE SARS-CoV-2 target nucleic acids are NOT DETECTED. The SARS-CoV-2 RNA is generally detectable in upper and lower  respiratory specimens during the acute phase of infection. The lowest  concentration of SARS-CoV-2 viral copies this assay can detect is 250  copies / mL. A negative result does not preclude SARS-CoV-2 infection  and should not be used as the sole basis for treatment or other  patient management decisions.  A negative result may occur with  improper specimen collection / handling, submission of specimen other  than nasopharyngeal swab, presence of viral mutation(s) within the  areas targeted by this assay, and inadequate number of viral copies  (<250 copies / mL). A negative result must be combined with clinical  observations, patient history, and epidemiological information. If result is POSITIVE SARS-CoV-2 target nucleic acids are DETECTED. The SARS-CoV-2 RNA is generally detectable in upper and lower  respiratory specimens dur ing the acute phase of infection.  Positive  results are indicative of active infection with SARS-CoV-2.  Clinical  correlation with patient history and other diagnostic information is  necessary to determine patient infection status.  Positive results do  not rule out bacterial infection or co-infection with other viruses. If result is PRESUMPTIVE POSTIVE SARS-CoV-2 nucleic acids MAY BE PRESENT.   A presumptive positive result was  obtained on the submitted specimen  and confirmed on repeat testing.  While 2019 novel coronavirus  (SARS-CoV-2) nucleic acids may be present in the submitted sample  additional confirmatory testing may be necessary for epidemiological  and / or clinical management purposes  to differentiate between  SARS-CoV-2 and other Sarbecovirus currently known to infect humans.  If clinically indicated additional testing with an alternate test  methodology [phone]) is advised. The SARS-CoV-2 RNA is generally  detectable in upper and lower respiratory sp ecimens during the acute  phase of infection. The expected result is Negative. Fact Sheet for Patients:  StrictlyIdeas.no Fact Sheet for Healthcare Providers: BankingDealers.co.za This test is not yet approved or cleared by the [place] FDA and has been authorized for detection and/or diagnosis of SARS-CoV-2 by FDA under an Emergency Use Authorization (EUA).  This EUA will remain in effect (meaning this test can be used) for the duration of the COVID-19 declaration under Section 564(b)(1) of the Act, 21 U.S.C. section 360bbb-3(b)(1), unless the authorization is terminated or revoked sooner. Performed at [facility], [address] [place], [place] [address]   TSH     Status: None   Collection Time: 05/06/19  6:56 AM  Result Value Ref Range   TSH 0.792 0.350 - 4.500 uIU/mL    Comment: Performed by a 3rd Generation assay with a functional sensitivity of <=0.01 uIU/mL. Performed at [facility], [place], [place] [address]   Troponin I (High Sensitivity)     Status: None   Collection Time: 05/06/19  6:56 AM  Result Value Ref Range   Troponin I (High Sensitivity) 14 <18 ng/L    Comment: (NOTE) Elevated high sensitivity troponin I (hsTnI) values and significant  changes across serial measurements may suggest ACS but many other  chronic and acute conditions  are known to elevate hsTnI results.  Refer to the "Links" section for chest pain algorithms and additional  guidance. Performed at [facility], [place], [phone]  [place], [place] [address]   Troponin I (High Sensitivity)     Status: None   Collection Time: 05/06/19  8:47 AM  Result Value Ref Range   Troponin I (High Sensitivity) 14 <18 ng/L    Comment: (NOTE) Elevated high sensitivity troponin I (hsTnI) values and significant  changes across serial measurements may suggest ACS but many other  chronic and acute conditions are known to elevate hsTnI results.  Refer to the "Links" section for chest pain algorithms and additional  guidance. Performed at [facility], [place], [place] [address]     Ct Angio Chest/abd/pel For Dissection W And/or W/wo  Result Date: 05/06/2019 CLINICAL DATA:  Chest/abdominal pain EXAM: CT ANGIOGRAPHY CHEST, ABDOMEN AND PELVIS TECHNIQUE: Multidetector CT imaging through the chest, abdomen and pelvis was performed using the standard protocol during bolus administration of intravenous contrast. Multiplanar reconstructed images and MIPs were obtained and reviewed to evaluate the vascular anatomy. CONTRAST:  169mL OMNIPAQUE IOHEXOL 350 MG/ML SOLN COMPARISON:  CT abdomen/pelvis dated 12/06/2017. CT chest dated 04/09/2008. FINDINGS: CTA CHEST FINDINGS Cardiovascular: On unenhanced CT, there is no evidence of mediastinal hematoma. Preferential opacification of the thoracic aorta. No evidence of thoracic aortic aneurysm or dissection. Atherosclerotic calcifications of the aortic arch. Although not tailored for evaluation of the pulmonary arteries, there is no evidence of pulmonary embolism to the segmental level. The heart is normal in size.  No pericardial effusion. Mild coronary atherosclerosis of the LAD. Mediastinum/Nodes: No suspicious mediastinal lymphadenopathy. Visualized thyroid is unremarkable. Lungs/Pleura: Evaluation of the lung  parenchyma is constrained by respiratory motion. Within that constraint, there are no suspicious pulmonary nodules. Mild bilateral lower lobe bronchiectasis with scarring. Mild eventration of the right hemidiaphragm with associated right basilar atelectasis. No focal consolidation. No pleural effusion or pneumothorax. Musculoskeletal: Degenerative changes of the thoracic spine. Review of the MIP images confirms the above findings. CTA ABDOMEN AND PELVIS FINDINGS VASCULAR Aorta: No evidence of abdominal aortic aneurysm or dissection. Patent. Atherosclerotic calcifications. Celiac: Patent. SMA: Patent.  Atherosclerotic calcifications proximally. Renals: Patent bilaterally. IMA: Patent. Inflow: Patent. Mild atherosclerotic calcifications of the bilateral internal iliac arteries. Veins: Unremarkable. Review of the MIP images confirms the above findings. NON-VASCULAR Hepatobiliary: No evidence of hepatic steatosis. 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 Gallbladder is unremarkable. No intrahepatic or extrahepatic ductal dilatation. Pancreas: Within normal limits. Spleen: Within normal limits. Adrenals/Urinary Tract: Adrenal glands are within normal limits. Kidneys there are within normal limits.  No hydronephrosis. Bladder is within normal limits. Stomach/Bowel: Stomach is notable for a tiny hiatal hernia. No evidence of bowel obstruction. Normal appendix (series 5/image 156). Mild left colonic diverticulosis, without evidence of diverticulitis. Lymphatic: No suspicious abdominopelvic lymphadenopathy. Mild nonspecific fat stranding in the central jejunal mesentery, without discrete lymphadenopathy. Reproductive: Prostate is unremarkable. Other: No abdominopelvic ascites. Musculoskeletal: Degenerative changes of the lumbar spine. Review of the MIP images confirms the above findings. IMPRESSION:  No evidence of thoracoabdominal aortic aneurysm or dissection. No evidence of pulmonary embolism. No CT findings to account for the patient's chest/abdominal pain. Additional ancillary findings as above. Electronically Signed   By: Julian Hy M.D.   On: 05/06/2019 01:41    Review of Systems  Constitutional: Negative.   HENT: Negative.   Eyes: Negative.   Respiratory: Negative.   Cardiovascular: Negative.   Gastrointestinal: Positive for heartburn.  Genitourinary: Negative.   Musculoskeletal: Negative.  Skin: Negative.   Neurological: Negative.   Endo/Heme/Allergies: Negative.   Psychiatric/Behavioral: Negative.    Blood pressure 116/68, pulse (!) 101, temperature 97.7 F (36.5 C), temperature source Oral, resp. rate 19, height 5\' 3"  (1.6 m), weight 114.3 kg, SpO2 96 %. Physical Exam  Nursing note and vitals reviewed. Constitutional: He is oriented to person, place, and time. He appears well-developed and well-nourished.  HENT:  Head: Normocephalic and atraumatic.  Eyes: Pupils are equal, round, and reactive to light. Conjunctivae and EOM are normal.  Neck: Normal range of motion. Neck supple.  Cardiovascular: Normal rate, regular rhythm and normal heart sounds.  Respiratory: Effort normal and breath sounds normal.  GI: Soft. Bowel sounds are normal.  Musculoskeletal: Normal range of motion.  Neurological: He is alert and oriented to person, place, and time. He has normal reflexes.  Skin: Skin is warm and dry.  Psychiatric: He has a normal mood and affect.    Assessment/Plan: Abdominal pain Possible atypical chest pain Borderline troponins Obesity Obstructive sleep apnea Asthma Hyperlipidemia Previous history of smoking . Plan Agree with admit rule out microinfarction Continue evaluate for EKG cardiac enzymes Recommend weight loss exercise portion control Consider GI consult for evaluation of abdominal pain Do not recommend any further cardiac studies as an  inpatient Consider work-up with echo and functional study as an outpatient Have the patient follow-up as an outpatient  Avan Gullett D Deshanda Molitor 05/06/2019, 2:49 PM

## 2019-05-06 NOTE — Consult Note (Signed)
Jonathon Bellows , MD [address], [place], [place], [place], [address] [address] [place], [place], [place], [address] Phone: [phone]  Fax: [phone]  Consultation  Referring Provider:     Dr Marthann Schiller Primary Care Physician:  Juluis Pitch, MD Primary Gastroenterologist:  Dr. Tiffany Kocher         Reason for Consultation:     Abdominal pain   Date of Admission:  05/06/2019 Date of Consultation:  05/06/2019         HPI:   Jaison Petraglia is a 73 y.o. male admitted with acute abdominal pain .   Last EGD in 11/2018 by Dr Tiffany Kocher showed Barrettes esophagus and gastritis.   Colonoscopy showed a few polyps. Bx showed esophagitis, reactive gastropathy, duodenitis.   Lipase ,LFT's ,CBC, normal   The pain began last night , epigastric, deep as described by him , not burning m non radiating ,relieved by pain meds and worse with movement , not worse with eating . No similar issue in the past . No recent alcohol or NSAID use.   Past Medical History:  Diagnosis Date  . Anemia   . Arthritis   . Asthma   . Back pain   . BPH (benign prostatic hyperplasia)   . Dermatophytosis of nail   . Enthesopathy of hip region   . Gastric ulcer   . H/O multiple allergies   . History of colonic polyps   . Hx of onychia and paronychia   . Hypersomnia   . Hypertension   . Lower extremity edema   . Pneumonia   . Prostatic hypertrophy   . Pure hypercholesterolemia   . Sleep apnea   . Testicular hypofunction   . Tremor    of the head  . Wheezing     Past Surgical History:  Procedure Laterality Date  . CATARACT EXTRACTION W/PHACO Right 09/01/2016   Procedure: CATARACT EXTRACTION PHACO AND INTRAOCULAR LENS PLACEMENT (IOC);  Surgeon: Birder Robson, MD;  Location: [facility] ORS;  Service: Ophthalmology;  Laterality: Right;  Lot# [id number] H [place]: 00:34.0 AP%:21.8 CDE: 7.39   . COLONOSCOPY WITH PROPOFOL N/A 11/30/2018   Procedure: COLONOSCOPY WITH PROPOFOL;  Surgeon: Manya Silvas, MD;   Location: [facility] ENDOSCOPY;  Service: Endoscopy;  Laterality: N/A;  . ESOPHAGOGASTRODUODENOSCOPY (EGD) WITH PROPOFOL N/A 11/30/2018   Procedure: ESOPHAGOGASTRODUODENOSCOPY (EGD) WITH PROPOFOL;  Surgeon: Manya Silvas, MD;  Location: [facility] ENDOSCOPY;  Service: Endoscopy;  Laterality: N/A;  . sleep apnea surgery    . TONSILLECTOMY      Prior to Admission medications   Medication Sig Start Date End Date Taking? Authorizing Provider  acetaminophen (TYLENOL) 500 MG tablet Take 500 mg by mouth every 6 (six) hours as needed for mild pain.    Yes [provider]  albuterol (PROVENTIL HFA;VENTOLIN HFA) 108 (90 Base) MCG/ACT inhaler Inhale 1 puff into the lungs every 4 (four) hours as needed for wheezing or shortness of breath.   Yes [provider]  augmented betamethasone dipropionate (DIPROLENE-AF) 0.05 % ointment Apply 1 application topically 2 (two) times daily. 08/23/16  Yes [provider]  famotidine (PEPCID) 20 MG tablet Take 20 mg by mouth at bedtime as needed for heartburn.    Yes [provider]  ferrous sulfate 325 (65 FE) MG tablet Take 325 mg by mouth daily with breakfast.   Yes [provider]  fexofenadine (ALLEGRA) 180 MG tablet Take 180 mg by mouth daily.   Yes [provider]  finasteride (PROSCAR) 5  MG tablet Take 5 mg by mouth daily.   Yes [provider]  fluticasone (FLONASE) 50 MCG/ACT nasal spray Place 2 sprays into both nostrils daily as needed for allergies or rhinitis.    Yes [provider]  glucosamine-chondroitin 500-400 MG tablet Take 1 tablet by mouth daily.    Yes [provider]  hydrocortisone cream 1 % Apply 1 application topically 2 (two) times daily.   Yes [provider]  ipratropium (ATROVENT) 0.06 % nasal spray Place 2 sprays into both nostrils 3 (three) times daily.    Yes [provider]  lisinopril-hydrochlorothiazide (PRINZIDE,ZESTORETIC) 20-12.5 MG tablet Take 1  tablet by mouth daily.    Yes [provider]  Melatonin 10 MG TABS Take 1 tablet by mouth at bedtime.   Yes [provider]  naproxen sodium (ALEVE) 220 MG tablet Take 220 mg by mouth 2 (two) times daily with a meal.    Yes [provider]  tamsulosin (FLOMAX) 0.4 MG CAPS capsule Take 0.4 mg by mouth 2 (two) times daily.   Yes [provider]  Grant Ruts INHUB 250-50 MCG/DOSE AEPB Inhale 1 puff into the lungs 2 (two) times a day. 04/07/19  Yes [provider]    No family history on file.   Social History   Tobacco Use  . Smoking status: Former Research scientist (life sciences)  . Smokeless tobacco: Never Used  Substance Use Topics  . Alcohol use: Yes    Comment: occasional none last 24hrs  . Drug use: Not Currently    Allergies as of 05/05/2019 - Review Complete 05/05/2019  Allergen Reaction Noted  . Erythromycin Nausea And Vomiting 12/06/2017  . Sulfa antibiotics  11/29/2018  . Tetracyclines & related  11/29/2018    Review of Systems:    All systems reviewed and negative except where noted in HPI.   Physical Exam:  Vital signs in last 24 hours: Temp:  [97.7 F (36.5 C)-99.3 F (37.4 C)] 97.7 F (36.5 C) (07/11 0708) Pulse Rate:  [78-102] 101 (07/11 0708) Resp:  [16-28] 19 (07/11 0708) BP: (116-176)/(68-85) 116/68 (07/11 0708) SpO2:  [96 %-100 %] 96 % (07/11 0708) Weight:  [114.3 kg] 114.3 kg (07/10 2129) Last BM Date: 05/05/19 General:   Pleasant, cooperative in NAD Head:  Normocephalic and atraumatic. Eyes:   No icterus.   Conjunctiva pink. PERRLA. Ears:  Normal auditory acuity. Neck:  Supple; no masses or thyroidomegaly Lungs: Respirations even and unlabored. Lungs clear to auscultation bilaterally.   No wheezes, crackles, or rhonchi.  Heart:  Regular rate and rhythm;  Without murmur, clicks, rubs or gallops Abdomen:  Soft, nondistended, mild epigastric tenderness, Normal bowel sounds. No appreciable masses or hepatomegaly.  No rebound or guarding.   Neurologic:  Alert and oriented x3;  grossly normal neurologically. Skin:  Intact without significant lesions or rashes. Cervical Nodes:  No significant cervical adenopathy. Psych:  Alert and cooperative. Normal affect.  LAB RESULTS: Recent Labs    05/05/19 2155  WBC 9.0  HGB 13.7  HCT 41.0  PLT 213   BMET Recent Labs    05/05/19 2155  NA 137  K 4.6  CL 103  CO2 25  GLUCOSE 160*  BUN 19  CREATININE 1.17  CALCIUM 9.0   LFT Recent Labs    05/05/19 2155  PROT 7.9  ALBUMIN 4.3  AST 23  ALT 24  ALKPHOS 99  BILITOT 0.5   PT/INR No results for input(s): LABPROT, INR in the last 72 hours.  STUDIES:  Ct Angio Chest/abd/pel For Dissection W And/or W/wo  Result Date: 05/06/2019 CLINICAL DATA:  Chest/abdominal pain EXAM: CT ANGIOGRAPHY CHEST, ABDOMEN AND PELVIS TECHNIQUE: Multidetector CT imaging through the chest, abdomen and pelvis was performed using the standard protocol during bolus administration of intravenous contrast. Multiplanar reconstructed images and MIPs were obtained and reviewed to evaluate the vascular anatomy. CONTRAST:  148mL OMNIPAQUE IOHEXOL 350 MG/ML SOLN COMPARISON:  CT abdomen/pelvis dated 12/06/2017. CT chest dated 04/09/2008. FINDINGS: CTA CHEST FINDINGS Cardiovascular: On unenhanced CT, there is no evidence of mediastinal hematoma. Preferential opacification of the thoracic aorta. No evidence of thoracic aortic aneurysm or dissection. Atherosclerotic calcifications of the aortic arch. Although not tailored for evaluation of the pulmonary arteries, there is no evidence of pulmonary embolism to the segmental level. The heart is normal in size.  No pericardial effusion. Mild coronary atherosclerosis of the LAD. Mediastinum/Nodes: No suspicious mediastinal lymphadenopathy. Visualized thyroid is unremarkable. Lungs/Pleura: Evaluation of the lung parenchyma is constrained by respiratory motion. Within that constraint, there are no suspicious pulmonary nodules.  Mild bilateral lower lobe bronchiectasis with scarring. Mild eventration of the right hemidiaphragm with associated right basilar atelectasis. No focal consolidation. No pleural effusion or pneumothorax. Musculoskeletal: Degenerative changes of the thoracic spine. Review of the MIP images confirms the above findings. CTA ABDOMEN AND PELVIS FINDINGS VASCULAR Aorta: No evidence of abdominal aortic aneurysm or dissection. Patent. Atherosclerotic calcifications. Celiac: Patent. SMA: Patent.  Atherosclerotic calcifications proximally. Renals: Patent bilaterally. IMA: Patent. Inflow: Patent. Mild atherosclerotic calcifications of the bilateral internal iliac arteries. Veins: Unremarkable. Review of the MIP images confirms the above findings. NON-VASCULAR Hepatobiliary: No evidence of hepatic steatosis. 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 Gallbladder is unremarkable. No intrahepatic or extrahepatic ductal dilatation. Pancreas: Within normal limits. Spleen: Within normal limits. Adrenals/Urinary Tract: Adrenal glands are within normal limits. Kidneys there are within normal limits.  No hydronephrosis. Bladder is within normal limits. Stomach/Bowel: Stomach is notable for a tiny hiatal hernia. No evidence of bowel obstruction. Normal appendix (series 5/image 156). Mild left colonic diverticulosis, without evidence of diverticulitis. Lymphatic: No suspicious abdominopelvic lymphadenopathy. Mild nonspecific fat stranding in the central jejunal mesentery, without discrete lymphadenopathy. Reproductive: Prostate is unremarkable. Other: No abdominopelvic ascites. Musculoskeletal: Degenerative changes of the lumbar spine. Review of the MIP images confirms the above findings. IMPRESSION: No evidence of thoracoabdominal aortic aneurysm or dissection. No evidence of pulmonary embolism. No CT findings to  account for the patient's chest/abdominal pain. Additional ancillary findings as above. Electronically Signed   By: Julian Hy M.D.   On: 05/06/2019 01:41      Impression / Plan:   Dawan Farney is a 73 y.o. y/o male admitted with abdominal pain . Normal LFT's, H/o esophagitis.   1. Cardiology has been consulted - F/u 2. Treat for GERD/esophagitis- PPI BID, carafate QID, if no better tomorrow then will plan for EGD+/- HIDA scan   Thank you for involving me in the care of this patient.      LOS: 0 days   Jonathon Bellows, MD  05/06/2019, 12:36 PM

## 2019-05-06 NOTE — H&P (Signed)
Zachary Chase is an 73 y.o. male.   Chief Complaint: Abdominal pain HPI: The patient with past medical history of gastric ulcer, hypertension, hyperlipidemia, asthma and BPH presents to the emergency department complaining of abdominal pain.  The patient points to his epigastric region as the source of pain.  He reports that it began while sitting at his computer and gradually worsened throughout the evening.  He thought eating might help but the pain only worsened.  He admits to some shortness of breath that is slightly worse than baseline but he denies vomiting or diaphoresis.  He admits to one episode of nausea.  Though his high-sensitivity troponin was negative the rate of increase prompted the emergency department staff to call the hospitalist service for further evaluation.  Past Medical History:  Diagnosis Date  . Anemia   . Arthritis   . Asthma   . Back pain   . BPH (benign prostatic hyperplasia)   . Dermatophytosis of nail   . Enthesopathy of hip region   . Gastric ulcer   . H/O multiple allergies   . History of colonic polyps   . Hx of onychia and paronychia   . Hypersomnia   . Hypertension   . Lower extremity edema   . Pneumonia   . Prostatic hypertrophy   . Pure hypercholesterolemia   . Sleep apnea   . Testicular hypofunction   . Tremor    of the head  . Wheezing     Past Surgical History:  Procedure Laterality Date  . CATARACT EXTRACTION W/PHACO Right 09/01/2016   Procedure: CATARACT EXTRACTION PHACO AND INTRAOCULAR LENS PLACEMENT (IOC);  Surgeon: Birder Robson, MD;  Location: [facility] ORS;  Service: Ophthalmology;  Laterality: Right;  Lot# [id number] H [place]: 00:34.0 AP%:21.8 CDE: 7.39   . COLONOSCOPY WITH PROPOFOL N/A 11/30/2018   Procedure: COLONOSCOPY WITH PROPOFOL;  Surgeon: Manya Silvas, MD;  Location: [facility] ENDOSCOPY;  Service: Endoscopy;  Laterality: N/A;  . ESOPHAGOGASTRODUODENOSCOPY (EGD) WITH PROPOFOL N/A 11/30/2018   Procedure:  ESOPHAGOGASTRODUODENOSCOPY (EGD) WITH PROPOFOL;  Surgeon: Manya Silvas, MD;  Location: [facility] ENDOSCOPY;  Service: Endoscopy;  Laterality: N/A;  . sleep apnea surgery    . TONSILLECTOMY      No family history on file. Social History:  reports that he has quit smoking. He has never used smokeless tobacco. He reports current alcohol use. He reports previous drug use.  Allergies:  Allergies  Allergen Reactions  . Erythromycin Nausea And Vomiting  . Sulfa Antibiotics   . Tetracyclines & Related     (Not in a hospital admission)   Results for orders placed or performed during the hospital encounter of 05/06/19 (from the past 48 hour(s))  Lipase, blood     Status: None   Collection Time: 05/05/19  9:55 PM  Result Value Ref Range   Lipase 36 11 - 51 U/L    Comment: Performed at [facility], [address] [place], [place] [address]  Comprehensive metabolic panel     Status: Abnormal   Collection Time: 05/05/19  9:55 PM  Result Value Ref Range   Sodium 137 135 - 145 mmol/L   Potassium 4.6 3.5 - 5.1 mmol/L   Chloride 103 98 - 111 mmol/L   CO2 25 22 - 32 mmol/L   Glucose, Bld 160 (H) 70 - 99 mg/dL   BUN 19 8 - 23 mg/dL   Creatinine, Ser 1.17 0.61 - 1.24 mg/dL   Calcium 9.0 8.9 - 10.3 mg/dL   Total  Protein 7.9 6.5 - 8.1 g/dL   Albumin 4.3 3.5 - 5.0 g/dL   AST 23 15 - 41 U/L   ALT 24 0 - 44 U/L   Alkaline Phosphatase 99 38 - 126 U/L   Total Bilirubin 0.5 0.3 - 1.2 mg/dL   GFR calc non Af Amer >60 >60 mL/min   GFR calc Af Amer >60 >60 mL/min   Anion gap 9 5 - 15    Comment: Performed at [facility], [address] [place], [place] [address]  CBC     Status: None   Collection Time: 05/05/19  9:55 PM  Result Value Ref Range   WBC 9.0 4.0 - 10.5 K/uL   RBC 4.50 4.22 - 5.81 MIL/uL   Hemoglobin 13.7 13.0 - 17.0 g/dL   HCT 41.0 39.0 - 52.0 %   MCV 91.1 80.0 - 100.0 fL   MCH 30.4 26.0 - 34.0 pg   MCHC 33.4 30.0 - 36.0 g/dL   RDW 13.9 11.5 - 15.5 %    Platelets 213 150 - 400 K/uL   nRBC 0.0 0.0 - 0.2 %    Comment: Performed at [facility], [place], [place] [address]  Troponin I (High Sensitivity)     Status: None   Collection Time: 05/05/19  9:55 PM  Result Value Ref Range   Troponin I (High Sensitivity) 5 <18 ng/L    Comment: (NOTE) Elevated high sensitivity troponin I (hsTnI) values and significant  changes across serial measurements may suggest ACS but many other  chronic and acute conditions are known to elevate hsTnI results.  Refer to the "Links" section for chest pain algorithms and additional  guidance. Performed at [facility], [address] [place], [place] [address]   Urinalysis, Complete w Microscopic     Status: Abnormal   Collection Time: 05/05/19  9:57 PM  Result Value Ref Range   Color, Urine YELLOW (A) YELLOW   APPearance CLEAR (A) CLEAR   Specific Gravity, Urine 1.015 1.005 - 1.030   pH 6.0 5.0 - 8.0   Glucose, UA NEGATIVE NEGATIVE mg/dL   Hgb urine dipstick NEGATIVE NEGATIVE   Bilirubin Urine NEGATIVE NEGATIVE   Ketones, ur NEGATIVE NEGATIVE mg/dL   Protein, ur NEGATIVE NEGATIVE mg/dL   Nitrite NEGATIVE NEGATIVE   Leukocytes,Ua NEGATIVE NEGATIVE   RBC / HPF 0-5 0 - 5 RBC/hpf   WBC, UA 0-5 0 - 5 WBC/hpf   Bacteria, UA NONE SEEN NONE SEEN   Squamous Epithelial / LPF 0-5 0 - 5   Mucus PRESENT     Comment: Performed at [facility], [place], [place] [address]  Troponin I (High Sensitivity)     Status: None   Collection Time: 05/06/19  3:21 AM  Result Value Ref Range   Troponin I (High Sensitivity) 10 <18 ng/L    Comment: (NOTE) Elevated high sensitivity troponin I (hsTnI) values and significant  changes across serial measurements may suggest ACS but many other  chronic and acute conditions are known to elevate hsTnI results.  Refer to the "Links" section for chest pain algorithms and additional  guidance. Performed at [facility], [address] [place], [place] [address]    Ct Angio Chest/abd/pel For Dissection W And/or W/wo  Result Date: 05/06/2019 CLINICAL DATA:  Chest/abdominal pain EXAM: CT ANGIOGRAPHY CHEST, ABDOMEN AND PELVIS TECHNIQUE: Multidetector CT imaging through the chest, abdomen and pelvis was performed using the standard protocol during  bolus administration of intravenous contrast. Multiplanar reconstructed images and MIPs were obtained and reviewed to evaluate the vascular anatomy. CONTRAST:  143mL OMNIPAQUE IOHEXOL 350 MG/ML SOLN COMPARISON:  CT abdomen/pelvis dated 12/06/2017. CT chest dated 04/09/2008. FINDINGS: CTA CHEST FINDINGS Cardiovascular: On unenhanced CT, there is no evidence of mediastinal hematoma. Preferential opacification of the thoracic aorta. No evidence of thoracic aortic aneurysm or dissection. Atherosclerotic calcifications of the aortic arch. Although not tailored for evaluation of the pulmonary arteries, there is no evidence of pulmonary embolism to the segmental level. The heart is normal in size.  No pericardial effusion. Mild coronary atherosclerosis of the LAD. Mediastinum/Nodes: No suspicious mediastinal lymphadenopathy. Visualized thyroid is unremarkable. Lungs/Pleura: Evaluation of the lung parenchyma is constrained by respiratory motion. Within that constraint, there are no suspicious pulmonary nodules. Mild bilateral lower lobe bronchiectasis with scarring. Mild eventration of the right hemidiaphragm with associated right basilar atelectasis. No focal consolidation. No pleural effusion or pneumothorax. Musculoskeletal: Degenerative changes of the thoracic spine. Review of the MIP images confirms the above findings. CTA ABDOMEN AND PELVIS FINDINGS VASCULAR Aorta: No evidence of abdominal aortic aneurysm or dissection. Patent. Atherosclerotic calcifications. Celiac: Patent. SMA: Patent.  Atherosclerotic calcifications proximally. Renals: Patent bilaterally. IMA: Patent. Inflow:  Patent. Mild atherosclerotic calcifications of the bilateral internal iliac arteries. Veins: Unremarkable. Review of the MIP images confirms the above findings. NON-VASCULAR Hepatobiliary: No evidence of hepatic steatosis. 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 Gallbladder is unremarkable. No intrahepatic or extrahepatic ductal dilatation. Pancreas: Within normal limits. Spleen: Within normal limits. Adrenals/Urinary Tract: Adrenal glands are within normal limits. Kidneys there are within normal limits.  No hydronephrosis. Bladder is within normal limits. Stomach/Bowel: Stomach is notable for a tiny hiatal hernia. No evidence of bowel obstruction. Normal appendix (series 5/image 156). Mild left colonic diverticulosis, without evidence of diverticulitis. Lymphatic: No suspicious abdominopelvic lymphadenopathy. Mild nonspecific fat stranding in the central jejunal mesentery, without discrete lymphadenopathy. Reproductive: Prostate is unremarkable. Other: No abdominopelvic ascites. Musculoskeletal: Degenerative changes of the lumbar spine. Review of the MIP images confirms the above findings. IMPRESSION: No evidence of thoracoabdominal aortic aneurysm or dissection. No evidence of pulmonary embolism. No CT findings to account for the patient's chest/abdominal pain. Additional ancillary findings as above. Electronically Signed   By: Julian Hy M.D.   On: 05/06/2019 01:41    Review of Systems  Constitutional: Negative for chills and fever.  HENT: Negative for sore throat and tinnitus.   Eyes: Negative for blurred vision and redness.  Respiratory: Negative for cough and shortness of breath.   Cardiovascular: Positive for chest pain. Negative for palpitations, orthopnea and PND.  Gastrointestinal: Positive for abdominal pain. Negative for diarrhea, nausea and vomiting.   Genitourinary: Negative for dysuria, frequency and urgency.  Musculoskeletal: Negative for joint pain and myalgias.  Skin: Negative for rash.       No lesions  Neurological: Negative for speech change, focal weakness and weakness.  Endo/Heme/Allergies: Does not bruise/bleed easily.       No temperature intolerance  Psychiatric/Behavioral: Negative for depression and suicidal ideas.    Blood pressure (!) 156/85, pulse (!) 102, temperature 98.4 F (36.9 C), temperature source Oral, resp. rate (!) 28, height 5\' 3"  (1.6 m), weight 114.3 kg, SpO2 97 %. Physical Exam  Vitals reviewed. Constitutional: He is oriented to person, place, and time. He appears well-developed and well-nourished. No distress.  HENT:  Head: Normocephalic  and atraumatic.  Mouth/Throat: Oropharynx is clear and moist.  Eyes: Pupils are equal, round, and reactive to light. Conjunctivae and EOM are normal. No scleral icterus.  Neck: Normal range of motion. Neck supple. No JVD present. No tracheal deviation present. No thyromegaly present.  Cardiovascular: Normal rate, regular rhythm and normal heart sounds. Exam reveals no gallop and no friction rub.  No murmur heard. Respiratory: Effort normal and breath sounds normal. No respiratory distress. He has no wheezes.  GI: Soft. Bowel sounds are normal. He exhibits no distension. There is no abdominal tenderness.  Genitourinary:    Genitourinary Comments: Deferred   Musculoskeletal: Normal range of motion.        General: No edema.  Lymphadenopathy:    He has no cervical adenopathy.  Neurological: He is alert and oriented to person, place, and time. No cranial nerve deficit.  Skin: Skin is warm and dry. No rash noted. No erythema.  Psychiatric: He has a normal mood and affect. His behavior is normal. Judgment and thought content normal.     Assessment/Plan This is a 73 year old male admitted for chest pain. 1.  Chest pain: Atypical in duration and presentation.   However, GI cocktail and PPI therapy have not relieved the patient's pain.  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  Continue to follow high-sensitivity troponin.  Consult cardiology.  Monitor telemetry. 2.  Hypertension: Intermittent control; continue lisinopril and hydrochlorothiazide 3.  Hyperlipidemia: Continue statin therapy 4.  BPH: Continue tamsulosin 5.  Asthma: Continue inhaled corticosteroid.  Albuterol as needed. 6.  DVT prophylaxis: Lovenox 7.  GI prophylaxis: H2 blocker per home regimen The patient is a full code.  Time spent on admission orders and patient care approximately 45 minutes  Harrie Foreman, MD 05/06/2019, 5:10 AM

## 2019-05-07 ENCOUNTER — Inpatient Hospital Stay: Payer: Medicare Other | Admitting: Anesthesiology

## 2019-05-07 ENCOUNTER — Encounter
Admission: EM | Disposition: A | Discharge status: Home / Self Care | Payer: Self-pay | Source: Home / Self Care | Attending: Internal Medicine

## 2019-05-07 ENCOUNTER — Observation Stay: Payer: Medicare Other

## 2019-05-07 ENCOUNTER — Inpatient Hospital Stay: Payer: Medicare Other

## 2019-05-07 DIAGNOSIS — K819 Cholecystitis, unspecified: Secondary | ICD-10-CM

## 2019-05-07 DIAGNOSIS — R0789 Other chest pain: Secondary | ICD-10-CM | POA: Diagnosis present

## 2019-05-07 DIAGNOSIS — J45909 Unspecified asthma, uncomplicated: Secondary | ICD-10-CM | POA: Diagnosis present

## 2019-05-07 DIAGNOSIS — Z9841 Cataract extraction status, right eye: Secondary | ICD-10-CM | POA: Diagnosis not present

## 2019-05-07 DIAGNOSIS — Z6841 Body Mass Index (BMI) 40.0 and over, adult: Secondary | ICD-10-CM | POA: Diagnosis not present

## 2019-05-07 DIAGNOSIS — Z881 Allergy status to other antibiotic agents status: Secondary | ICD-10-CM | POA: Diagnosis not present

## 2019-05-07 DIAGNOSIS — K210 Gastro-esophageal reflux disease with esophagitis: Secondary | ICD-10-CM | POA: Diagnosis present

## 2019-05-07 DIAGNOSIS — N400 Enlarged prostate without lower urinary tract symptoms: Secondary | ICD-10-CM | POA: Diagnosis present

## 2019-05-07 DIAGNOSIS — Z87891 Personal history of nicotine dependence: Secondary | ICD-10-CM | POA: Diagnosis not present

## 2019-05-07 DIAGNOSIS — K82A1 Gangrene of gallbladder in cholecystitis: Secondary | ICD-10-CM | POA: Diagnosis not present

## 2019-05-07 DIAGNOSIS — B961 Klebsiella pneumoniae [K. pneumoniae] as the cause of diseases classified elsewhere: Secondary | ICD-10-CM | POA: Diagnosis not present

## 2019-05-07 DIAGNOSIS — K8010 Calculus of gallbladder with chronic cholecystitis without obstruction: Secondary | ICD-10-CM | POA: Diagnosis not present

## 2019-05-07 DIAGNOSIS — R7881 Bacteremia: Secondary | ICD-10-CM | POA: Diagnosis not present

## 2019-05-07 DIAGNOSIS — R1010 Upper abdominal pain, unspecified: Secondary | ICD-10-CM | POA: Diagnosis not present

## 2019-05-07 DIAGNOSIS — R109 Unspecified abdominal pain: Secondary | ICD-10-CM | POA: Diagnosis present

## 2019-05-07 DIAGNOSIS — K810 Acute cholecystitis: Secondary | ICD-10-CM | POA: Diagnosis present

## 2019-05-07 DIAGNOSIS — Z961 Presence of intraocular lens: Secondary | ICD-10-CM | POA: Diagnosis present

## 2019-05-07 DIAGNOSIS — K429 Umbilical hernia without obstruction or gangrene: Secondary | ICD-10-CM | POA: Diagnosis present

## 2019-05-07 DIAGNOSIS — I10 Essential (primary) hypertension: Secondary | ICD-10-CM | POA: Diagnosis present

## 2019-05-07 DIAGNOSIS — Z882 Allergy status to sulfonamides status: Secondary | ICD-10-CM | POA: Diagnosis not present

## 2019-05-07 DIAGNOSIS — N179 Acute kidney failure, unspecified: Secondary | ICD-10-CM | POA: Diagnosis present

## 2019-05-07 DIAGNOSIS — G4733 Obstructive sleep apnea (adult) (pediatric): Secondary | ICD-10-CM | POA: Diagnosis present

## 2019-05-07 DIAGNOSIS — Z1159 Encounter for screening for other viral diseases: Secondary | ICD-10-CM | POA: Diagnosis not present

## 2019-05-07 DIAGNOSIS — R160 Hepatomegaly, not elsewhere classified: Secondary | ICD-10-CM | POA: Diagnosis present

## 2019-05-07 DIAGNOSIS — E785 Hyperlipidemia, unspecified: Secondary | ICD-10-CM | POA: Diagnosis present

## 2019-05-07 DIAGNOSIS — A4159 Other Gram-negative sepsis: Secondary | ICD-10-CM | POA: Diagnosis present

## 2019-05-07 HISTORY — PX: CHOLECYSTECTOMY: SHX55

## 2019-05-07 LAB — COMPREHENSIVE METABOLIC PANEL
ALT: 26 U/L (ref 0–44)
AST: 25 U/L (ref 15–41)
Albumin: 3.3 g/dL — ABNORMAL LOW (ref 3.5–5.0)
Alkaline Phosphatase: 67 U/L (ref 38–126)
Anion gap: 9 (ref 5–15)
BUN: 25 mg/dL — ABNORMAL HIGH (ref 8–23)
CO2: 23 mmol/L (ref 22–32)
Calcium: 7.9 mg/dL — ABNORMAL LOW (ref 8.9–10.3)
Chloride: 103 mmol/L (ref 98–111)
Creatinine, Ser: 1.57 mg/dL — ABNORMAL HIGH (ref 0.61–1.24)
GFR calc Af Amer: 50 mL/min — ABNORMAL LOW (ref >60)
GFR calc non Af Amer: 43 mL/min — ABNORMAL LOW (ref >60)
Glucose, Bld: 179 mg/dL — ABNORMAL HIGH (ref 70–99)
Potassium: 4.1 mmol/L (ref 3.5–5.1)
Sodium: 135 mmol/L (ref 135–145)
Total Bilirubin: 1 mg/dL (ref 0.3–1.2)
Total Protein: 6.5 g/dL (ref 6.5–8.1)

## 2019-05-07 LAB — LIPASE, BLOOD: Lipase: 20 U/L (ref 11–51)

## 2019-05-07 LAB — CBC
HCT: 39.3 % (ref 39.0–52.0)
Hemoglobin: 13 g/dL (ref 13.0–17.0)
MCH: 30.9 pg (ref 26.0–34.0)
MCHC: 33.1 g/dL (ref 30.0–36.0)
MCV: 93.3 fL (ref 80.0–100.0)
Platelets: 136 10*3/uL — ABNORMAL LOW (ref 150–400)
RBC: 4.21 MIL/uL — ABNORMAL LOW (ref 4.22–5.81)
RDW: 14.6 % (ref 11.5–15.5)
WBC: 10.7 10*3/uL — ABNORMAL HIGH (ref 4.0–10.5)
nRBC: 0 % (ref 0.0–0.2)

## 2019-05-07 LAB — BLOOD CULTURE ID PANEL (REFLEXED)

## 2019-05-07 LAB — SARS CORONAVIRUS 2 BY RT PCR (HOSPITAL ORDER, PERFORMED IN ~~LOC~~ HOSPITAL LAB): SARS Coronavirus 2: NEGATIVE

## 2019-05-07 LAB — LACTIC ACID, PLASMA: Lactic Acid, Venous: 1.1 mmol/L (ref 0.5–1.9)

## 2019-05-07 SURGERY — LAPAROSCOPIC CHOLECYSTECTOMY
Anesthesia: General | Site: Abdomen

## 2019-05-07 MED ORDER — SODIUM CHLORIDE 0.9 % IV BOLUS
1000.0000 mL | Freq: Once | INTRAVENOUS | Status: AC
  Administered 2019-05-07: 1000 mL via INTRAVENOUS

## 2019-05-07 MED ORDER — ACETAMINOPHEN 500 MG PO TABS
1000.0000 mg | ORAL_TABLET | Freq: Four times a day (QID) | ORAL | Status: DC
  Administered 2019-05-07 – 2019-05-10 (×11): 1000 mg via ORAL
  Filled 2019-05-07 (×11): qty 2

## 2019-05-07 MED ORDER — KETOROLAC TROMETHAMINE 15 MG/ML IJ SOLN
INTRAMUSCULAR | Status: AC
  Administered 2019-05-07: 15 mg via INTRAVENOUS
  Filled 2019-05-07: qty 1

## 2019-05-07 MED ORDER — DEXAMETHASONE SODIUM PHOSPHATE 10 MG/ML IJ SOLN
INTRAMUSCULAR | Status: DC | PRN
  Administered 2019-05-07: 10 mg via INTRAVENOUS

## 2019-05-07 MED ORDER — EPHEDRINE SULFATE 50 MG/ML IJ SOLN
INTRAMUSCULAR | Status: DC | PRN
  Administered 2019-05-07: 10 mg via INTRAVENOUS

## 2019-05-07 MED ORDER — OXYCODONE HCL 5 MG PO TABS: 5.0000 mg | ORAL_TABLET | ORAL | Status: DC | PRN

## 2019-05-07 MED ORDER — LACTATED RINGERS IV SOLN
INTRAVENOUS | Status: DC
  Administered 2019-05-07 – 2019-05-10 (×7): via INTRAVENOUS

## 2019-05-07 MED ORDER — PIPERACILLIN-TAZOBACTAM 3.375 G IVPB
3.3750 g | Freq: Three times a day (TID) | INTRAVENOUS | Status: DC
  Administered 2019-05-07: 3.375 g via INTRAVENOUS
  Filled 2019-05-07: qty 50

## 2019-05-07 MED ORDER — PIPERACILLIN-TAZOBACTAM 3.375 G IVPB
3.3750 g | Freq: Three times a day (TID) | INTRAVENOUS | Status: DC
  Administered 2019-05-07 – 2019-05-10 (×8): 3.375 g via INTRAVENOUS
  Filled 2019-05-07 (×8): qty 50

## 2019-05-07 MED ORDER — MIDAZOLAM HCL 2 MG/2ML IJ SOLN
INTRAMUSCULAR | Status: DC | PRN
  Administered 2019-05-07: 1 mg via INTRAVENOUS

## 2019-05-07 MED ORDER — FENTANYL CITRATE (PF) 250 MCG/5ML IJ SOLN
INTRAMUSCULAR | Status: AC
  Filled 2019-05-07: qty 5

## 2019-05-07 MED ORDER — LACTATED RINGERS IV SOLN
INTRAVENOUS | Status: DC | PRN
  Administered 2019-05-07 (×2): via INTRAVENOUS

## 2019-05-07 MED ORDER — IOHEXOL 300 MG/ML  SOLN
75.0000 mL | Freq: Once | INTRAMUSCULAR | Status: AC | PRN
  Administered 2019-05-07: 75 mL via INTRAVENOUS

## 2019-05-07 MED ORDER — SODIUM CHLORIDE 0.9 % IV SOLN
2.0000 g | INTRAVENOUS | Status: DC
  Administered 2019-05-07: 2 g via INTRAVENOUS
  Filled 2019-05-07: qty 20

## 2019-05-07 MED ORDER — METOPROLOL TARTRATE 25 MG PO TABS
12.5000 mg | ORAL_TABLET | Freq: Two times a day (BID) | ORAL | Status: DC
  Administered 2019-05-07 – 2019-05-10 (×6): 12.5 mg via ORAL
  Filled 2019-05-07 (×7): qty 1

## 2019-05-07 MED ORDER — MORPHINE SULFATE (PF) 2 MG/ML IV SOLN: 2.0000 mg | INTRAVENOUS | Status: DC | PRN

## 2019-05-07 MED ORDER — PROPOFOL 10 MG/ML IV BOLUS
INTRAVENOUS | Status: AC
  Filled 2019-05-07: qty 20

## 2019-05-07 MED ORDER — SUGAMMADEX SODIUM 200 MG/2ML IV SOLN
INTRAVENOUS | Status: DC | PRN
  Administered 2019-05-07: 220.4 mg via INTRAVENOUS

## 2019-05-07 MED ORDER — BUPIVACAINE-EPINEPHRINE 0.25% -1:200000 IJ SOLN
INTRAMUSCULAR | Status: DC | PRN
  Administered 2019-05-07: 30 mL

## 2019-05-07 MED ORDER — BUPIVACAINE-EPINEPHRINE (PF) 0.25% -1:200000 IJ SOLN
INTRAMUSCULAR | Status: AC
  Filled 2019-05-07: qty 30

## 2019-05-07 MED ORDER — PROPOFOL 10 MG/ML IV BOLUS
INTRAVENOUS | Status: DC | PRN
  Administered 2019-05-07: 100 mg via INTRAVENOUS

## 2019-05-07 MED ORDER — OXYCODONE HCL 5 MG PO TABS: 5.0000 mg | ORAL_TABLET | Freq: Once | ORAL | Status: DC | PRN

## 2019-05-07 MED ORDER — FENTANYL CITRATE (PF) 100 MCG/2ML IJ SOLN
INTRAMUSCULAR | Status: DC | PRN
  Administered 2019-05-07 (×3): 50 ug via INTRAVENOUS

## 2019-05-07 MED ORDER — SODIUM CHLORIDE 0.9 % IR SOLN
Status: DC | PRN
  Administered 2019-05-07: 1000 mL

## 2019-05-07 MED ORDER — SODIUM CHLORIDE 0.9 % IV SOLN
INTRAVENOUS | Status: DC | PRN
  Administered 2019-05-07: 50 ug/min via INTRAVENOUS

## 2019-05-07 MED ORDER — SUCCINYLCHOLINE CHLORIDE 20 MG/ML IJ SOLN
INTRAMUSCULAR | Status: DC | PRN
  Administered 2019-05-07: 160 mg via INTRAVENOUS

## 2019-05-07 MED ORDER — IOHEXOL 240 MG/ML SOLN
25.0000 mL | INTRAMUSCULAR | Status: AC
  Administered 2019-05-07 (×2): 25 mL via ORAL

## 2019-05-07 MED ORDER — MIDAZOLAM HCL 2 MG/2ML IJ SOLN
INTRAMUSCULAR | Status: AC
  Filled 2019-05-07: qty 2

## 2019-05-07 MED ORDER — OXYCODONE HCL 5 MG/5ML PO SOLN: 5.0000 mg | Freq: Once | ORAL | Status: DC | PRN

## 2019-05-07 MED ORDER — SODIUM CHLORIDE 0.9 % IV SOLN
INTRAVENOUS | Status: AC
  Administered 2019-05-07 (×2): via INTRAVENOUS

## 2019-05-07 MED ORDER — SEVOFLURANE IN SOLN
RESPIRATORY_TRACT | Status: AC
  Filled 2019-05-07: qty 250

## 2019-05-07 MED ORDER — PHENYLEPHRINE HCL (PRESSORS) 10 MG/ML IV SOLN
INTRAVENOUS | Status: DC | PRN
  Administered 2019-05-07: 100 ug via INTRAVENOUS

## 2019-05-07 MED ORDER — METRONIDAZOLE IN NACL 5-0.79 MG/ML-% IV SOLN
500.0000 mg | Freq: Three times a day (TID) | INTRAVENOUS | Status: DC
  Administered 2019-05-07: 500 mg via INTRAVENOUS
  Filled 2019-05-07 (×4): qty 100

## 2019-05-07 MED ORDER — BUPIVACAINE LIPOSOME 1.3 % IJ SUSP
INTRAMUSCULAR | Status: AC
  Filled 2019-05-07: qty 20

## 2019-05-07 MED ORDER — KETOROLAC TROMETHAMINE 15 MG/ML IJ SOLN
15.0000 mg | Freq: Four times a day (QID) | INTRAMUSCULAR | Status: DC
  Administered 2019-05-07 – 2019-05-10 (×11): 15 mg via INTRAVENOUS
  Filled 2019-05-07 (×10): qty 1

## 2019-05-07 MED ORDER — FENTANYL CITRATE (PF) 100 MCG/2ML IJ SOLN: 25.0000 ug | INTRAMUSCULAR | Status: DC | PRN

## 2019-05-07 MED ORDER — BUPIVACAINE LIPOSOME 1.3 % IJ SUSP
INTRAMUSCULAR | Status: DC | PRN
  Administered 2019-05-07: 20 mL

## 2019-05-07 MED ORDER — ROCURONIUM BROMIDE 100 MG/10ML IV SOLN
INTRAVENOUS | Status: DC | PRN
  Administered 2019-05-07: 40 mg via INTRAVENOUS
  Administered 2019-05-07: 10 mg via INTRAVENOUS

## 2019-05-07 SURGICAL SUPPLY — 58 items
APPLICATOR COTTON TIP 6 STRL (MISCELLANEOUS) ×1 IMPLANT
APPLICATOR COTTON TIP 6IN STRL (MISCELLANEOUS) ×3
APPLIER CLIP 5 13 M/L LIGAMAX5 (MISCELLANEOUS)
BULB RESERV EVAC DRAIN JP 100C (MISCELLANEOUS) ×3 IMPLANT
CANISTER SUCT 1200ML W/VALVE (MISCELLANEOUS) ×3 IMPLANT
CHLORAPREP W/TINT 26 (MISCELLANEOUS) ×3 IMPLANT
CHOLANGIOGRAM CATH TAUT (CATHETERS) IMPLANT
CLIP APPLIE 5 13 M/L LIGAMAX5 (MISCELLANEOUS) IMPLANT
COVER WAND RF STERILE (DRAPES) IMPLANT
CUTTER FLEX LINEAR 45M (STAPLE) ×3 IMPLANT
DECANTER SPIKE VIAL GLASS SM (MISCELLANEOUS) IMPLANT
DERMABOND ADVANCED (GAUZE/BANDAGES/DRESSINGS) ×2
DERMABOND ADVANCED .7 DNX12 (GAUZE/BANDAGES/DRESSINGS) ×1 IMPLANT
DRAIN CHANNEL JP 15F RND 16 (MISCELLANEOUS) ×3 IMPLANT
DRAPE C-ARM XRAY 36X54 (DRAPES) IMPLANT
DRSG OPSITE POSTOP 3X4 (GAUZE/BANDAGES/DRESSINGS) ×6 IMPLANT
DRSG OPSITE POSTOP 4X10 (GAUZE/BANDAGES/DRESSINGS) ×3 IMPLANT
DRSG OPSITE POSTOP 4X12 (GAUZE/BANDAGES/DRESSINGS) IMPLANT
ELECT CAUTERY BLADE 6.4 (BLADE) ×3 IMPLANT
ELECT REM PT RETURN 9FT ADLT (ELECTROSURGICAL) ×3
ELECTRODE REM PT RTRN 9FT ADLT (ELECTROSURGICAL) ×1 IMPLANT
GLOVE BIO SURGEON STRL SZ7 (GLOVE) ×9 IMPLANT
GOWN STRL REUS W/ TWL LRG LVL3 (GOWN DISPOSABLE) ×3 IMPLANT
GOWN STRL REUS W/TWL LRG LVL3 (GOWN DISPOSABLE) ×6
IRRIGATION STRYKERFLOW (MISCELLANEOUS) ×1 IMPLANT
IRRIGATOR STRYKERFLOW (MISCELLANEOUS) ×3
IV CATH ANGIO 12GX3 LT BLUE (NEEDLE) IMPLANT
IV NS 1000ML (IV SOLUTION) ×2
IV NS 1000ML BAXH (IV SOLUTION) ×1 IMPLANT
K-WIRE 1.6 (WIRE)
K-WIRE FX5X1.6XNS BN SS (WIRE)
KWIRE FX5X1.6XNS BN SS (WIRE) IMPLANT
L-HOOK LAP DISP 36CM (ELECTROSURGICAL) ×3
LHOOK LAP DISP 36CM (ELECTROSURGICAL) ×1 IMPLANT
NEEDLE HYPO 22GX1.5 SAFETY (NEEDLE) ×3 IMPLANT
NS IRRIG 500ML POUR BTL (IV SOLUTION) ×3 IMPLANT
PACK LAP CHOLECYSTECTOMY (MISCELLANEOUS) ×3 IMPLANT
PENCIL ELECTRO HAND CTR (MISCELLANEOUS) ×3 IMPLANT
POUCH SPECIMEN RETRIEVAL 10MM (ENDOMECHANICALS) ×3 IMPLANT
RELOAD STAPLE TA45 3.5 REG BLU (ENDOMECHANICALS) ×3 IMPLANT
SCISSORS METZENBAUM CVD 33 (INSTRUMENTS) IMPLANT
SET TUBE SMOKE EVAC HIGH FLOW (TUBING) ×3 IMPLANT
SLEEVE ENDOPATH XCEL 5M (ENDOMECHANICALS) ×9 IMPLANT
SPONGE LAP 18X18 RF (DISPOSABLE) ×6 IMPLANT
STAPLER SKIN PROX 35W (STAPLE) ×3 IMPLANT
STOPCOCK 4 WAY LG BORE MALE ST (IV SETS) IMPLANT
SUT ETHIBOND 0 MO6 C/R (SUTURE) IMPLANT
SUT ETHILON 3-0 FS-10 30 BLK (SUTURE) ×3
SUT MNCRL AB 4-0 PS2 18 (SUTURE) ×3 IMPLANT
SUT PDS AB 0 CT1 27 (SUTURE) ×3 IMPLANT
SUT VICRYL 0 AB UR-6 (SUTURE) ×6 IMPLANT
SUTURE EHLN 3-0 FS-10 30 BLK (SUTURE) ×1 IMPLANT
SYR 20CC LL (SYRINGE) ×3 IMPLANT
SYS LAPSCP GELPORT 120MM (MISCELLANEOUS) ×3
SYSTEM LAPSCP GELPORT 120MM (MISCELLANEOUS) ×1 IMPLANT
TROCAR XCEL 12X100 BLDLESS (ENDOMECHANICALS) ×3 IMPLANT
TROCAR XCEL BLUNT TIP 100MML (ENDOMECHANICALS) ×3 IMPLANT
TROCAR XCEL NON-BLD 5MMX100MML (ENDOMECHANICALS) ×3 IMPLANT

## 2019-05-07 NOTE — Consult Note (Signed)
Pharmacy Antibiotic Note  Zachary Chase is a 73 y.o. male admitted on 05/06/2019 with Intra-Abdominal Infection.  Pharmacy has been consulted for Pip/tazo dosing.  Plan: Zosyn 3.375g IV q8h (4 hour infusion).  Height: 5\' 3"  (160 cm) Weight: 243 lb (110.2 kg) IBW/kg (Calculated) : 56.9  Temp (24hrs), Avg:100.1 F (37.8 C), Min:98.2 F (36.8 C), Max:102.1 F (38.9 C)  Recent Labs  Lab 05/05/19 2155 05/06/19 2335 05/07/19 0946  WBC 9.0  --   --   CREATININE 1.17  --  1.57*  LATICACIDVEN  --  1.1  --     Estimated Creatinine Clearance: 46.3 mL/min (A) (by C-G formula based on SCr of 1.57 mg/dL (H)).    Allergies  Allergen Reactions  . Erythromycin Nausea And Vomiting  . Sulfa Antibiotics Other (See Comments)    unknown  . Tetracyclines & Related Nausea And Vomiting    Antimicrobials this admission: 7/12 pip/tazo >>   Dose adjustments this admission: None  Microbiology results: 7/12 BCx: pending   Thank you for allowing pharmacy to be a part of this patient's care.  Oswald Hillock, PharmD, BCPS 05/07/2019 10:35 AM

## 2019-05-07 NOTE — Progress Notes (Signed)
Patient transported via bed to OR.

## 2019-05-07 NOTE — Progress Notes (Signed)
Zachary Chase , MD [address], [place], [place], [place], [address] [address] [place], [place], [place], [address] Phone: [phone]  Fax: [phone]   Zachary Chase is being followed for abdominal pain  Day 1 of follow up   Subjective: Overnight febrile   Objective: Vital signs in last 24 hours: Vitals:   05/07/19 0059 05/07/19 0130 05/07/19 0414 05/07/19 0713  BP:   112/65 115/63  Pulse:  96 89 89  Resp:   16 (!) 25  Temp: (!) 100.8 F (38.2 C)  98.2 F (36.8 C) 98.2 F (36.8 C)  TempSrc: Oral  Oral Oral  SpO2:   96% 95%  Weight:   110.2 kg   Height:       Weight change: -4.082 kg  Intake/Output Summary (Last 24 hours) at 05/07/2019 1431 Last data filed at 05/07/2019 1132 Gross per 24 hour  Intake --  Output 600 ml  Net -600 ml     Exam: Heart:: Regular rate and rhythm, S1S2 present or without murmur or extra heart sounds Lungs: normal, clear to auscultation and clear to auscultation and percussion Abdomen: soft, mild RUQ tendernss, normal bowel sounds   Lab Results: _0 @ Micro Results: Recent Results (from the past 240 hour(s))  SARS Coronavirus 2 (CEPHEID - Performed in [facility] hospital lab), Hosp Order     Status: None   Collection Time: 05/06/19  4:58 AM   Specimen: Nasopharyngeal Swab  Result Value Ref Range Status   SARS Coronavirus 2 NEGATIVE NEGATIVE Final    Comment: (NOTE) If result is NEGATIVE SARS-CoV-2 target nucleic acids are NOT DETECTED. The SARS-CoV-2 RNA is generally detectable in upper and lower  respiratory specimens during the acute phase of infection. The lowest  concentration of SARS-CoV-2 viral copies this assay can detect is 250  copies / mL. A negative result does not preclude SARS-CoV-2 infection  and should not be used as the sole basis for treatment or other  patient management decisions.  A negative result may occur with  improper specimen collection / handling, submission of specimen other   than nasopharyngeal swab, presence of viral mutation(s) within the  areas targeted by this assay, and inadequate number of viral copies  (<250 copies / mL). A negative result must be combined with clinical  observations, patient history, and epidemiological information. If result is POSITIVE SARS-CoV-2 target nucleic acids are DETECTED. The SARS-CoV-2 RNA is generally detectable in upper and lower  respiratory specimens dur ing the acute phase of infection.  Positive  results are indicative of active infection with SARS-CoV-2.  Clinical  correlation with patient history and other diagnostic information is  necessary to determine patient infection status.  Positive results do  not rule out bacterial infection or co-infection with other viruses. If result is PRESUMPTIVE POSTIVE SARS-CoV-2 nucleic acids MAY BE PRESENT.   A presumptive positive result was obtained on the submitted specimen  and confirmed on repeat testing.  While 2019 novel coronavirus  (SARS-CoV-2) nucleic acids may be present in the submitted sample  additional confirmatory testing may be necessary for epidemiological  and / or clinical management purposes  to differentiate between  SARS-CoV-2 and other Sarbecovirus currently known to infect humans.  If clinically indicated additional testing with an alternate test  methodology [phone]) is advised. The SARS-CoV-2 RNA is generally  detectable in upper and lower respiratory sp ecimens during the acute  phase of infection. The expected result is Negative. Fact Sheet for Patients:  StrictlyIdeas.no  Fact Sheet for Healthcare Providers: BankingDealers.co.za This test is not yet approved or cleared by the [place] FDA and has been authorized for detection and/or diagnosis of SARS-CoV-2 by FDA under an Emergency Use Authorization (EUA).  This EUA will remain in effect (meaning this test can be used) for the duration of  the COVID-19 declaration under Section 564(b)(1) of the Act, 21 U.S.C. section 360bbb-3(b)(1), unless the authorization is terminated or revoked sooner. Performed at [facility], [address], [place], [place] [address]   SARS Coronavirus 2 (CEPHEID- Performed in [facility] hospital lab), Hosp Order     Status: None   Collection Time: 05/06/19 11:35 PM   Specimen: Nasopharyngeal Swab  Result Value Ref Range Status   SARS Coronavirus 2 NEGATIVE NEGATIVE Final    Comment: (NOTE) If result is NEGATIVE SARS-CoV-2 target nucleic acids are NOT DETECTED. The SARS-CoV-2 RNA is generally detectable in upper and lower  respiratory specimens during the acute phase of infection. The lowest  concentration of SARS-CoV-2 viral copies this assay can detect is 250  copies / mL. A negative result does not preclude SARS-CoV-2 infection  and should not be used as the sole basis for treatment or other  patient management decisions.  A negative result may occur with  improper specimen collection / handling, submission of specimen other  than nasopharyngeal swab, presence of viral mutation(s) within the  areas targeted by this assay, and inadequate number of viral copies  (<250 copies / mL). A negative result must be combined with clinical  observations, patient history, and epidemiological information. If result is POSITIVE SARS-CoV-2 target nucleic acids are DETECTED. The SARS-CoV-2 RNA is generally detectable in upper and lower  respiratory specimens dur ing the acute phase of infection.  Positive  results are indicative of active infection with SARS-CoV-2.  Clinical  correlation with patient history and other diagnostic information is  necessary to determine patient infection status.  Positive results do  not rule out bacterial infection or co-infection with other viruses. If result is PRESUMPTIVE POSTIVE SARS-CoV-2 nucleic acids MAY BE PRESENT.   A presumptive positive result was  obtained on the submitted specimen  and confirmed on repeat testing.  While 2019 novel coronavirus  (SARS-CoV-2) nucleic acids may be present in the submitted sample  additional confirmatory testing may be necessary for epidemiological  and / or clinical management purposes  to differentiate between  SARS-CoV-2 and other Sarbecovirus currently known to infect humans.  If clinically indicated additional testing with an alternate test  methodology [id number] is advised. The SARS-CoV-2 RNA is generally  detectable in upper and lower respiratory sp ecimens during the acute  phase of infection. The expected result is Negative. Fact Sheet for Patients:  StrictlyIdeas.no Fact Sheet for Healthcare Providers: BankingDealers.co.za This test is not yet approved or cleared by the [place] FDA and has been authorized for detection and/or diagnosis of SARS-CoV-2 by FDA under an Emergency Use Authorization (EUA).  This EUA will remain in effect (meaning this test can be used) for the duration of the COVID-19 declaration under Section 564(b)(1) of the Act, 21 U.S.C. section 360bbb-3(b)(1), unless the authorization is terminated or revoked sooner. Performed at [facility], [address] [place],  [address]   Culture, blood (Routine X 2) w Reflex to ID Panel     Status: None (Preliminary result)   Collection Time: 05/07/19  1:03 AM   Specimen: BLOOD  Result Value Ref Range Status   Specimen Description BLOOD  LEFT ANTECUBITAL  Final   Special Requests   Final    BOTTLES DRAWN AEROBIC AND ANAEROBIC Blood Culture adequate volume   Culture  Setup Time   Final    Organism ID to follow GRAM NEGATIVE RODS IN BOTH AEROBIC AND ANAEROBIC BOTTLES CRITICAL RESULT CALLED TO, READ BACK BY AND VERIFIED WITH: LISA KLUTTZ AT 1243 05/07/2019 [place] Performed at [facility], [address], [place], [place] [address]    Culture GRAM  NEGATIVE RODS  Final   Report Status PENDING  Incomplete  Culture, blood (Routine X 2) w Reflex to ID Panel     Status: None (Preliminary result)   Collection Time: 05/07/19  1:03 AM   Specimen: BLOOD  Result Value Ref Range Status   Specimen Description BLOOD LEFT HAND  Final   Special Requests   Final    BOTTLES DRAWN AEROBIC AND ANAEROBIC Blood Culture adequate volume   Culture  Setup Time   Final    GRAM NEGATIVE RODS AEROBIC BOTTLE ONLY CRITICAL VALUE NOTED.  VALUE IS CONSISTENT WITH PREVIOUSLY REPORTED AND CALLED VALUE. Performed at [facility], [address] [place], [place] [address]    Culture GRAM NEGATIVE RODS  Final   Report Status PENDING  Incomplete  Blood Culture ID Panel (Reflexed)     Status: Abnormal   Collection Time: 05/07/19  1:03 AM  Result Value Ref Range Status   Enterococcus species NOT DETECTED NOT DETECTED Final   Listeria monocytogenes NOT DETECTED NOT DETECTED Final   Staphylococcus species NOT DETECTED NOT DETECTED Final   Staphylococcus aureus (BCID) NOT DETECTED NOT DETECTED Final   Streptococcus species NOT DETECTED NOT DETECTED Final   Streptococcus agalactiae NOT DETECTED NOT DETECTED Final   Streptococcus pneumoniae NOT DETECTED NOT DETECTED Final   Streptococcus pyogenes NOT DETECTED NOT DETECTED Final   Acinetobacter baumannii NOT DETECTED NOT DETECTED Final   Enterobacteriaceae species DETECTED (A) NOT DETECTED Final    Comment: Enterobacteriaceae represent a large family of gram-negative bacteria, not a single organism. CRITICAL RESULT CALLED TO, READ BACK BY AND VERIFIED WITH:  LISA KLUTTZ AT 4235 05/07/2019 [facility]    Enterobacter cloacae complex NOT DETECTED NOT DETECTED Final   Escherichia coli NOT DETECTED NOT DETECTED Final   Klebsiella oxytoca NOT DETECTED NOT DETECTED Final   Klebsiella pneumoniae DETECTED (A) NOT DETECTED Final    Comment: CRITICAL RESULT CALLED TO, READ BACK BY AND VERIFIED WITH:  LISA KLUTTZ AT 1243  05/07/2019 [facility]    Proteus species NOT DETECTED NOT DETECTED Final   Serratia marcescens NOT DETECTED NOT DETECTED Final   Carbapenem resistance NOT DETECTED NOT DETECTED Final   Haemophilus influenzae NOT DETECTED NOT DETECTED Final   Neisseria meningitidis NOT DETECTED NOT DETECTED Final   Pseudomonas aeruginosa NOT DETECTED NOT DETECTED Final   Candida albicans NOT DETECTED NOT DETECTED Final   Candida glabrata NOT DETECTED NOT DETECTED Final   Candida krusei NOT DETECTED NOT DETECTED Final   Candida parapsilosis NOT DETECTED NOT DETECTED Final   Candida tropicalis NOT DETECTED NOT DETECTED Final    Comment: Performed at [facility], [address], [place],  [address]   Studies/Results: Ct Abdomen Pelvis W Contrast  Result Date: 05/07/2019 CLINICAL DATA:  Epigastric abdominal pain for 2-3 days.  Sepsis. EXAM: CT ABDOMEN AND PELVIS WITH CONTRAST TECHNIQUE: Multidetector CT imaging of the abdomen and pelvis was performed using the standard protocol following bolus administration of intravenous contrast. CONTRAST:  75 mL OMNIPAQUE IOHEXOL  300 MG/ML  SOLN COMPARISON:  CT abdomen and pelvis 12/06/2017. CT chest, abdomen and pelvis 05/06/2019. FINDINGS: Lower chest: No pleural or pericardial effusion. Mild bronchiectasis in the right lower lobe is unchanged. There is some dependent atelectasis. Lung bases otherwise clear. Hepatobiliary: There appears to be sludge or a few small stones in the gallbladder. Mild stranding is seen about the inferior aspect of the gallbladder. Liver and biliary tree appear normal. Pancreas: Unremarkable. No pancreatic ductal dilatation or surrounding inflammatory changes. Spleen: Normal in size without focal abnormality. Adrenals/Urinary Tract: Adrenal glands are unremarkable. Kidneys are normal, without renal calculi, focal lesion, or hydronephrosis. There is some contrast material in the urinary bladder from the patient's CT scan yesterday bladder is  otherwise unremarkable. Stomach/Bowel: Stomach is within normal limits. Appendix appears normal. Walls of the first portion of the duodenum are thickened but it is incompletely distended. It is partially distended on yesterday's examination and wall thickening is less apparent on that study. Small bowel is otherwise unremarkable. Scattered diverticulosis without diverticulitis noted. Vascular/Lymphatic: Aortic atherosclerosis. No enlarged abdominal or pelvic lymph nodes. Reproductive: Prostatomegaly noted. Other: Small fat containing umbilical and bilateral inguinal hernias are seen. Musculoskeletal: No fracture or focal lesion. Degenerative disease about the hips and lumbar spine noted. IMPRESSION: Stones or sludge within the gallbladder. There is stranding about the gallbladder and first portion of the duodenum which could be due to cholecystitis or duodenitis. Walls of the first portion the duodenum are thickened but the it is under distended on today's study. Mild thickening is seen on yesterday's exam. Right upper quadrant ultrasound could be used for further evaluation of the gallbladder. Atherosclerosis. Diverticulosis without diverticulitis. Small fat containing umbilical and bilateral inguinal hernias. Electronically Signed   By: Inge Rise M.D.   On: 05/07/2019 14:04   Ct Angio Chest/abd/pel For Dissection W And/or W/wo  Result Date: 05/06/2019 CLINICAL DATA:  Chest/abdominal pain EXAM: CT ANGIOGRAPHY CHEST, ABDOMEN AND PELVIS TECHNIQUE: Multidetector CT imaging through the chest, abdomen and pelvis was performed using the standard protocol during bolus administration of intravenous contrast. Multiplanar reconstructed images and MIPs were obtained and reviewed to evaluate the vascular anatomy. CONTRAST:  171m OMNIPAQUE IOHEXOL 350 MG/ML SOLN COMPARISON:  CT abdomen/pelvis dated 12/06/2017. CT chest dated 04/09/2008. FINDINGS: CTA CHEST FINDINGS Cardiovascular: On unenhanced CT, there is no  evidence of mediastinal hematoma. Preferential opacification of the thoracic aorta. No evidence of thoracic aortic aneurysm or dissection. Atherosclerotic calcifications of the aortic arch. Although not tailored for evaluation of the pulmonary arteries, there is no evidence of pulmonary embolism to the segmental level. The heart is normal in size.  No pericardial effusion. Mild coronary atherosclerosis of the LAD. Mediastinum/Nodes: No suspicious mediastinal lymphadenopathy. Visualized thyroid is unremarkable. Lungs/Pleura: Evaluation of the lung parenchyma is constrained by respiratory motion. Within that constraint, there are no suspicious pulmonary nodules. Mild bilateral lower lobe bronchiectasis with scarring. Mild eventration of the right hemidiaphragm with associated right basilar atelectasis. No focal consolidation. No pleural effusion or pneumothorax. Musculoskeletal: Degenerative changes of the thoracic spine. Review of the MIP images confirms the above findings. CTA ABDOMEN AND PELVIS FINDINGS VASCULAR Aorta: No evidence of abdominal aortic aneurysm or dissection. Patent. Atherosclerotic calcifications. Celiac: Patent. SMA: Patent.  Atherosclerotic calcifications proximally. Renals: Patent bilaterally. IMA: Patent. Inflow: Patent. Mild atherosclerotic calcifications of the bilateral internal iliac arteries. Veins: Unremarkable. Review of the MIP images confirms the above findings. NON-VASCULAR Hepatobiliary: No evidence of hepatic steatosis. However, there is hyperperfusion along the gallbladder fossa (series 5/image  93), nonspecific and likely reflecting a benign pseudolesion in the setting of normal LFTs. Gallbladder is unremarkable. No intrahepatic or extrahepatic ductal dilatation. Pancreas: Within normal limits. Spleen: Within normal limits. Adrenals/Urinary Tract: Adrenal glands are within normal limits. Kidneys there are within normal limits.  No hydronephrosis. Bladder is within normal limits.  Stomach/Bowel: Stomach is notable for a tiny hiatal hernia. No evidence of bowel obstruction. Normal appendix (series 5/image 156). Mild left colonic diverticulosis, without evidence of diverticulitis. Lymphatic: No suspicious abdominopelvic lymphadenopathy. Mild nonspecific fat stranding in the central jejunal mesentery, without discrete lymphadenopathy. Reproductive: Prostate is unremarkable. Other: No abdominopelvic ascites. Musculoskeletal: Degenerative changes of the lumbar spine. Review of the MIP images confirms the above findings. IMPRESSION: No evidence of thoracoabdominal aortic aneurysm or dissection. No evidence of pulmonary embolism. No CT findings to account for the patient's chest/abdominal pain. Additional ancillary findings as above. Electronically Signed   By: Julian Hy M.D.   On: 05/06/2019 01:41   Medications: I have reviewed the patient's current medications. Scheduled Meds:  docusate sodium  100 mg Oral BID   enoxaparin (LOVENOX) injection  40 mg Subcutaneous Q12H   ferrous sulfate  325 mg Oral Q breakfast   finasteride  5 mg Oral Daily   lisinopril  20 mg Oral Daily   Or   hydrochlorothiazide  12.5 mg Oral Daily   loratadine  10 mg Oral Daily   Melatonin  10 mg Oral QHS   mometasone-formoterol  2 puff Inhalation BID   pantoprazole (PROTONIX) IV  40 mg Intravenous Q12H   sucralfate  1 g Oral TID WC & HS   tamsulosin  0.4 mg Oral BID   Continuous Infusions:  sodium chloride 100 mL/hr at 05/07/19 1304   cefTRIAXone (ROCEPHIN)  IV     metronidazole     PRN Meds:.acetaminophen **OR** acetaminophen, albuterol, famotidine, fluticasone, ibuprofen, ipratropium, ondansetron **OR** ondansetron (ZOFRAN) IV, oxyCODONE-acetaminophen  Hepatic Function Latest Ref Rng & Units 05/07/2019 05/05/2019 12/06/2017  Total Protein 6.5 - 8.1 g/dL 6.5 7.9 8.5(H)  Albumin 3.5 - 5.0 g/dL 3.3(L) 4.3 4.2  AST 15 - 41 U/L _0 ALT 0 - 44 U/L _1 Alk Phosphatase 38  - 126 U/L 67 99 87  Total Bilirubin 0.3 - 1.2 mg/dL 1.0 0.5 0.7    Assessment: Active Problems:   Chest pain   Abdominal pain  Zachary Chase is a 73 y.o. y/o male admitted with abdominal pain . Normal LFT's, H/o esophagitis. Overnight febrilem culture of blood positive for Klebsiella . Ct abdomen concerning for duodenitis and cholecystitis  1.Treat for GERD/esophagitis/duodentitis - PPI BID, carafate QID  2. Suggest surgery evaluation for ?cholecystitis - will inform Dr Marthann Schiller   3. Would not pursue endoscopy at this time as it wont change management     LOS: 0 days   Zachary Bellows, MD 05/07/2019, 2:31 PM

## 2019-05-07 NOTE — Anesthesia Procedure Notes (Signed)
Procedure Name: Intubation Date/Time: 05/07/2019 7:10 PM Performed by: Nelda Marseille, CRNA Pre-anesthesia Checklist: Patient identified, Patient being monitored, Timeout performed, Emergency Drugs available and Suction available Patient Re-evaluated:Patient Re-evaluated prior to induction Oxygen Delivery Method: Circle system utilized Preoxygenation: Pre-oxygenation with 100% oxygen Induction Type: IV induction Ventilation: Mask ventilation without difficulty Laryngoscope Size: Mac, 3 and McGraph Grade View: Grade I Tube type: Oral Tube size: 7.5 mm Number of attempts: 1 Airway Equipment and Method: Stylet Placement Confirmation: ETT inserted through vocal cords under direct vision,  positive ETCO2 and breath sounds checked- equal and bilateral Secured at: 21 cm Tube secured with: Tape Dental Injury: Teeth and Oropharynx as per pre-operative assessment

## 2019-05-07 NOTE — Progress Notes (Addendum)
Consent obtained and placed in chart after pt spoke to surgeon; pt states is anxious to get better, and wants to get surgery done as soon as possible. Remains NPO, except med. Given Tylenol for abd "soreness" and fever, and general appearance of not feeling well. .   Denies SOB, although appears dyspneic, and audibly wheezing. States he wheezes frequently, although appears to have increased work of breathing since this am. Bilateral crackles noted in bases, similar to this am.  Received 1 liter NS as ordered. Has received IV antibiotics today as ordered. NS currently at 100 ml/hr. Given po Metoprolol 12.5 mg. Resp therapy called to give pt albuterol neb.   Received call from [place]; gave brief report.

## 2019-05-07 NOTE — Anesthesia Preprocedure Evaluation (Addendum)
Anesthesia Evaluation  Patient identified by MRN, date of birth, ID band Patient awake    Reviewed: Allergy & Precautions, H&P , NPO status , Patient's Chart, lab work & pertinent test results  Airway Mallampati: III  TM Distance: >3 FB Neck ROM: limited   Comment: Large neck Dental  (+) Poor Dentition   Pulmonary shortness of breath, asthma , sleep apnea , former smoker,           Cardiovascular hypertension, (-) angina(-) Past MI, (-) Cardiac Stents and (-) CABG      Neuro/Psych negative neurological ROS  negative psych ROS   GI/Hepatic Neg liver ROS, PUD,   Endo/Other  Morbid obesity  Renal/GU      Musculoskeletal  (+) Arthritis ,   Abdominal   Peds  Hematology negative hematology ROS (+)   Anesthesia Other Findings Past Medical History: No date: Anemia No date: Arthritis No date: Asthma No date: Back pain No date: BPH (benign prostatic hyperplasia) No date: Dermatophytosis of nail No date: Enthesopathy of hip region No date: Gastric ulcer No date: H/O multiple allergies No date: History of colonic polyps No date: Hx of onychia and paronychia No date: Hypersomnia No date: Hypertension No date: Lower extremity edema No date: Pneumonia No date: Prostatic hypertrophy No date: Pure hypercholesterolemia No date: Sleep apnea 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 No date: Tremor     Comment:  of the head No date: Wheezing  Past Surgical History: 09/01/2016: CATARACT EXTRACTION W/PHACO; Right     Comment:  Procedure: CATARACT EXTRACTION PHACO AND INTRAOCULAR               LENS PLACEMENT (IOC);  Surgeon: Birder Robson, MD;                Location: [facility] ORS;  Service: Ophthalmology;  Laterality:              Right;  Lot# [id number] H [place]: 00:34.0 AP%:21.8 CDE: 7.39  11/30/2018: COLONOSCOPY WITH PROPOFOL; N/A     Comment:  Procedure: COLONOSCOPY WITH PROPOFOL;  Surgeon: Manya Silvas, MD;   Location: [facility] ENDOSCOPY;  Service:               Endoscopy;  Laterality: N/A; 11/30/2018: ESOPHAGOGASTRODUODENOSCOPY (EGD) WITH PROPOFOL; N/A     Comment:  Procedure: ESOPHAGOGASTRODUODENOSCOPY (EGD) WITH               PROPOFOL;  Surgeon: Manya Silvas, MD;  Location:               [facility] ENDOSCOPY;  Service: Endoscopy;  Laterality: N/A; No date: sleep apnea surgery No date: TONSILLECTOMY  BMI    Body Mass Index: 43.05 kg/m      Reproductive/Obstetrics negative OB ROS                            Anesthesia Physical Anesthesia Plan  ASA: III  Anesthesia Plan: General ETT   Post-op Pain Management:    Induction:   PONV Risk Score and Plan: Ondansetron, Dexamethasone and Treatment may vary due to age or medical condition  Airway Management Planned:   Additional Equipment:   Intra-op Plan:   Post-operative Plan:   Informed Consent: I have reviewed the patients History and Physical, chart, labs and discussed the procedure including the risks, benefits and alternatives for the proposed anesthesia with the patient or authorized representative who  has indicated his/her understanding and acceptance.     Dental Advisory Given  Plan Discussed with: Anesthesiologist, CRNA and Surgeon  Anesthesia Plan Comments:        Anesthesia Quick Evaluation

## 2019-05-07 NOTE — Consult Note (Signed)
Patient ID: Zachary Chase, male   DOB: [birth year], 73 y.o.   MRN: [id number]  HPI Zachary Chase is a 73 y.o. male seen in consultation at the request of Dr.Vachhani.  Came to the emergency room yesterday morning with severe abdominal pain.  The patient reports that the pain is in the epigastric area and in the right upper quadrant.  Started Friday night.  Patient reports pain is constant and worsening over time. At this time he continues to have intermittent pain within the right upper quadrant.  He has been febrile and his cultures have been growing gram-negative rods despite aggressive antibiotic therapy.  He is having chills and is becoming septic.  He also reports decreased appetite and had something as a full liquids at 9 AM.  No history of biliary obstruction.  CT scan personal review showing evidence of enlarged gallbladder with stones.  No free air.  The significant inflammatory response around the duodenum.  No free air.  There is an umbilical hernia.  Laboratory values show a surprisingly normal CBC except the platelets that is 136.  He denies any easy bruising or bleeding. He does have an acute kidney injury with a creatinine of 1.57 but normal bilirubin and normal LFTs. He is states that he was able to perform more than 4 METS of activity without shortness of breath or chest pain before coming to the hospital.  HPI  Past Medical History:  Diagnosis Date  . Anemia   . Arthritis   . Asthma   . Back pain   . BPH (benign prostatic hyperplasia)   . Dermatophytosis of nail   . Enthesopathy of hip region   . Gastric ulcer   . H/O multiple allergies   . History of colonic polyps   . Hx of onychia and paronychia   . Hypersomnia   . Hypertension   . Lower extremity edema   . Pneumonia   . Prostatic hypertrophy   . Pure hypercholesterolemia   . Sleep apnea   . Testicular hypofunction   . Tremor    of the head  . Wheezing     Past Surgical History:  Procedure  Laterality Date  . CATARACT EXTRACTION W/PHACO Right 09/01/2016   Procedure: CATARACT EXTRACTION PHACO AND INTRAOCULAR LENS PLACEMENT (IOC);  Surgeon: Birder Robson, MD;  Location: [facility] ORS;  Service: Ophthalmology;  Laterality: Right;  Lot# [id number] H [place]: 00:34.0 AP%:21.8 CDE: 7.39   . COLONOSCOPY WITH PROPOFOL N/A 11/30/2018   Procedure: COLONOSCOPY WITH PROPOFOL;  Surgeon: Manya Silvas, MD;  Location: [facility] ENDOSCOPY;  Service: Endoscopy;  Laterality: N/A;  . ESOPHAGOGASTRODUODENOSCOPY (EGD) WITH PROPOFOL N/A 11/30/2018   Procedure: ESOPHAGOGASTRODUODENOSCOPY (EGD) WITH PROPOFOL;  Surgeon: Manya Silvas, MD;  Location: [facility] ENDOSCOPY;  Service: Endoscopy;  Laterality: N/A;  . sleep apnea surgery    . TONSILLECTOMY      No family history on file.  Social History Social History   Tobacco Use  . Smoking status: Former Research scientist (life sciences)  . Smokeless tobacco: Never Used  Substance Use Topics  . Alcohol use: Yes    Comment: occasional none last 24hrs  . Drug use: Not Currently    Allergies  Allergen Reactions  . Erythromycin Nausea And Vomiting  . Sulfa Antibiotics Other (See Comments)    unknown  . Tetracyclines & Related Nausea And Vomiting    Current Facility-Administered Medications  Medication Dose Route Frequency Provider Last Rate Last Dose  . 0.9 %  sodium chloride infusion   Intravenous  Continuous Vaughan Basta, MD 100 mL/hr at 05/07/19 1304    . acetaminophen (TYLENOL) tablet 650 mg  650 mg Oral Q6H PRN Harrie Foreman, MD   650 mg at 05/06/19 2037   Or  . acetaminophen (TYLENOL) suppository 650 mg  650 mg Rectal Q6H PRN Harrie Foreman, MD      . albuterol (PROVENTIL) (2.5 MG/3ML) 0.083% nebulizer solution 2.5 mg  2.5 mg Nebulization Q4H PRN Harrie Foreman, MD   2.5 mg at 05/06/19 2055  . docusate sodium (COLACE) capsule 100 mg  100 mg Oral BID Harrie Foreman, MD   100 mg at 05/06/19 2037  . enoxaparin (LOVENOX) injection 40 mg  40 mg Subcutaneous  Q12H Harrie Foreman, MD   40 mg at 05/07/19 0908  . famotidine (PEPCID) tablet 20 mg  20 mg Oral QHS PRN Harrie Foreman, MD      . ferrous sulfate tablet 325 mg  325 mg Oral Q breakfast Harrie Foreman, MD   325 mg at 05/07/19 0911  . finasteride (PROSCAR) tablet 5 mg  5 mg Oral Daily Harrie Foreman, MD   5 mg at 05/07/19 0909  . fluticasone (FLONASE) 50 MCG/ACT nasal spray 2 spray  2 spray Each Nare Daily PRN Harrie Foreman, MD      . ibuprofen (ADVIL) tablet 400 mg  400 mg Oral Q6H PRN Lance Coon, MD   400 mg at 05/06/19 2328  . ipratropium (ATROVENT) 0.06 % nasal spray 2 spray  2 spray Each Nare TID PRN Harrie Foreman, MD      . loratadine (CLARITIN) tablet 10 mg  10 mg Oral Daily Harrie Foreman, MD   10 mg at 05/07/19 0913  . Melatonin TABS 10 mg  10 mg Oral QHS Harrie Foreman, MD      . metoprolol tartrate (LOPRESSOR) tablet 12.5 mg  12.5 mg Oral BID Vaughan Basta, MD      . mometasone-formoterol [facility] 200-5 MCG/ACT inhaler 2 puff  2 puff Inhalation BID Harrie Foreman, MD   2 puff at 05/07/19 0913  . ondansetron (ZOFRAN) tablet 4 mg  4 mg Oral Q6H PRN Harrie Foreman, MD       Or  . ondansetron [facility]) injection 4 mg  4 mg Intravenous Q6H PRN Harrie Foreman, MD      . oxyCODONE-acetaminophen (PERCOCET/ROXICET) 5-325 MG per tablet 1 tablet  1 tablet Oral Q8H PRN Vaughan Basta, MD   1 tablet at 05/06/19 1246  . pantoprazole (PROTONIX) injection 40 mg  40 mg Intravenous Q12H Jonathon Bellows, MD   40 mg at 05/07/19 0906  . piperacillin-tazobactam (ZOSYN) IVPB 3.375 g  3.375 g Intravenous Q8H Latiqua Daloia F, MD      . sodium chloride 0.9 % bolus 1,000 mL  1,000 mL Intravenous Once Nolene Rocks F, MD      . sucralfate (CARAFATE) tablet 1 g  1 g Oral TID WC & HS Jonathon Bellows, MD   1 g at 05/07/19 0909  . tamsulosin (FLOMAX) capsule 0.4 mg  0.4 mg Oral BID Harrie Foreman, MD   0.4 mg at 05/07/19 3875     Review of Systems Full ROS   was asked and was negative except for the information on the HPI  Physical Exam Blood pressure 119/74, pulse (!) 101, temperature 100.2 F (37.9 C), temperature source Oral, resp. rate (!) 23, height 5\' 3"  (1.6 m), weight 110.2 kg, SpO2 97 %.  CONSTITUTIONAL: Uncomfortable and shivering, toxic in appearance consistent with sepsis. Morbidly obese. EYES: Pupils are equal, round, and reactive to light, Sclera are non-icteric. EARS, NOSE, MOUTH AND THROAT: The oropharynx is clear. The oral mucosa is pink and moist. Hearing is intact to voice. LYMPH NODES:  Lymph nodes in the neck are normal. RESPIRATORY:  Lungs are clear. There is normal respiratory effort, with equal breath sounds bilaterally, and without pathologic use of accessory muscles. CARDIOVASCULAR: Heart is regular without murmurs, gallops, or rubs. GI: The abdomen is very tender to palpation in the right upper quadrant epigastric area with positive Murphy sign.  No peritonitis.  He does have a large abdomen GU: Rectal deferred.   MUSCULOSKELETAL: Normal muscle strength and tone. No cyanosis or edema.   SKIN: Turgor is good and there are no pathologic skin lesions or ulcers. NEUROLOGIC: Motor and sensation is grossly normal. Cranial nerves are grossly intact. PSYCH:  Oriented to person, place and time. Affect is normal.  Data Reviewed  I have personally reviewed the patient's imaging, laboratory findings and medical records.    Assessment/Plan 73 year old male with acute cholecystitis likely gangrenous in nature given persistent fever and sepsis despite aggressive fluid resuscitation and antibiotic therapy.  Given how sick he is I do think that the best option will be to proceed with an urgent cholecystectomy. I discussed the procedure in detail.  The patient was given Neurosurgeon.  We discussed the risks and benefits of a laparoscopic cholecystectomy and possible cholangiogram including, but not limited to bleeding, infection,  injury to surrounding structures such as the intestine or liver, bile leak, retained gallstones, need to convert to an open procedure, prolonged diarrhea, blood clots such as  DVT, common bile duct injury, anesthesia risks, and possible need for additional procedures.  The likelihood of improvement in symptoms and return to the patient's normal status is good. We discussed the typical post-operative recovery course. He understands that given his acute illness there is a higher increase chance of conversion to open and perioperative complications. He understands and agrees.  I will start him back on Zosyn given his persistent sepsis and will do his cholecystectomy as soon as possible.  Caroleen Hamman, MD FACS General Surgeon 05/07/2019, 4:28 PM

## 2019-05-07 NOTE — Progress Notes (Signed)
[place] [facility] [place] NAME: Zachary Chase    MR#:  [id number]  DATE OF BIRTH:  [birth year]  SUBJECTIVE:  CHIEF COMPLAINT:   Chief Complaint  Patient presents with  . Abdominal Pain   Came with abdominal and epigastric pain.  Still have RUQ  pain.  Troponins are negative. He had fever last night.  REVIEW OF SYSTEMS:  CONSTITUTIONAL: No fever, fatigue or weakness.  EYES: No blurred or double vision.  EARS, NOSE, AND THROAT: No tinnitus or ear pain.  RESPIRATORY: No cough, shortness of breath, wheezing or hemoptysis.  CARDIOVASCULAR: No chest pain, orthopnea, edema.  GASTROINTESTINAL: No nausea, vomiting, diarrhea , have abdominal pain.  GENITOURINARY: No dysuria, hematuria.  ENDOCRINE: No polyuria, nocturia,  HEMATOLOGY: No anemia, easy bruising or bleeding SKIN: No rash or lesion. MUSCULOSKELETAL: No joint pain or arthritis.   NEUROLOGIC: No tingling, numbness, weakness.  PSYCHIATRY: No anxiety or depression.   ROS  DRUG ALLERGIES:   Allergies  Allergen Reactions  . Erythromycin Nausea And Vomiting  . Sulfa Antibiotics Other (See Comments)    unknown  . Tetracyclines & Related Nausea And Vomiting    VITALS:  Blood pressure 115/63, pulse 89, temperature 98.2 F (36.8 C), temperature source Oral, resp. rate (!) 25, height 5\' 3"  (1.6 m), weight 110.2 kg, SpO2 95 %.  PHYSICAL EXAMINATION:  GENERAL:  73 y.o.-year-old patient lying in the bed with no acute distress.  EYES: Pupils equal, round, reactive to light and accommodation. No scleral icterus. Extraocular muscles intact.  HEENT: Head atraumatic, normocephalic. Oropharynx and nasopharynx clear.  NECK:  Supple, no jugular venous distention. No thyroid enlargement, no tenderness.  LUNGS: Normal breath sounds bilaterally, no wheezing, rales,rhonchi or crepitation. No use of accessory muscles of respiration.  CARDIOVASCULAR: S1, S2 normal. No murmurs, rubs, or gallops.  ABDOMEN:  Soft, RUQ tender, nondistended. Bowel sounds present. No organomegaly or mass.  EXTREMITIES: No pedal edema, cyanosis, or clubbing.  NEUROLOGIC: Cranial nerves II through XII are intact. Muscle strength 5/5 in all extremities. Sensation intact. Gait not checked.  PSYCHIATRIC: The patient is alert and oriented x 3.  SKIN: No obvious rash, lesion, or ulcer.   Physical Exam LABORATORY PANEL:   CBC Recent Labs  Lab 05/07/19 0946  WBC 10.7*  HGB 13.0  HCT 39.3  PLT 136*   ------------------------------------------------------------------------------------------------------------------  Chemistries  Recent Labs  Lab 05/07/19 0946  NA 135  K 4.1  CL 103  CO2 23  GLUCOSE 179*  BUN 25*  CREATININE 1.57*  CALCIUM 7.9*  AST 25  ALT 26  ALKPHOS 67  BILITOT 1.0   ------------------------------------------------------------------------------------------------------------------  Cardiac Enzymes No results for input(s): TROPONINI in the last 168 hours. ------------------------------------------------------------------------------------------------------------------  RADIOLOGY:  Ct Abdomen Pelvis W Contrast  Result Date: 05/07/2019 CLINICAL DATA:  Epigastric abdominal pain for 2-3 days.  Sepsis. EXAM: CT ABDOMEN AND PELVIS WITH CONTRAST TECHNIQUE: Multidetector CT imaging of the abdomen and pelvis was performed using the standard protocol following bolus administration of intravenous contrast. CONTRAST:  75 mL OMNIPAQUE IOHEXOL 300 MG/ML  SOLN COMPARISON:  CT abdomen and pelvis 12/06/2017. CT chest, abdomen and pelvis 05/06/2019. FINDINGS: Lower chest: No pleural or pericardial effusion. Mild bronchiectasis in the right lower lobe is unchanged. There is some dependent atelectasis. Lung bases otherwise clear. Hepatobiliary: There appears to be sludge or a few small stones in the gallbladder. Mild stranding is seen about the inferior aspect of the gallbladder. Liver and biliary tree appear  normal.  Pancreas: Unremarkable. No pancreatic ductal dilatation or surrounding inflammatory changes. Spleen: Normal in size without focal abnormality. Adrenals/Urinary Tract: Adrenal glands are unremarkable. Kidneys are normal, without renal calculi, focal lesion, or hydronephrosis. There is some contrast material in the urinary bladder from the patient's CT scan yesterday bladder is otherwise unremarkable. Stomach/Bowel: Stomach is within normal limits. Appendix appears normal. Walls of the first portion of the duodenum are thickened but it is incompletely distended. It is partially distended on yesterday's examination and wall thickening is less apparent on that study. Small bowel is otherwise unremarkable. Scattered diverticulosis without diverticulitis noted. Vascular/Lymphatic: Aortic atherosclerosis. No enlarged abdominal or pelvic lymph nodes. Reproductive: Prostatomegaly noted. Other: Small fat containing umbilical and bilateral inguinal hernias are seen. Musculoskeletal: No fracture or focal lesion. Degenerative disease about the hips and lumbar spine noted. IMPRESSION: Stones or sludge within the gallbladder. There is stranding about the gallbladder and first portion of the duodenum which could be due to cholecystitis or duodenitis. Walls of the first portion the duodenum are thickened but the it is under distended on today's study. Mild thickening is seen on yesterday's exam. Right upper quadrant ultrasound could be used for further evaluation of the gallbladder. Atherosclerosis. Diverticulosis without diverticulitis. Small fat containing umbilical and bilateral inguinal hernias. Electronically Signed   By: Inge Rise M.D.   On: 05/07/2019 14:04   Ct Angio Chest/abd/pel For Dissection W And/or W/wo  Result Date: 05/06/2019 CLINICAL DATA:  Chest/abdominal pain EXAM: CT ANGIOGRAPHY CHEST, ABDOMEN AND PELVIS TECHNIQUE: Multidetector CT imaging through the chest, abdomen and pelvis was performed  using the standard protocol during bolus administration of intravenous contrast. Multiplanar reconstructed images and MIPs were obtained and reviewed to evaluate the vascular anatomy. CONTRAST:  128mL OMNIPAQUE IOHEXOL 350 MG/ML SOLN COMPARISON:  CT abdomen/pelvis dated 12/06/2017. CT chest dated 04/09/2008. FINDINGS: CTA CHEST FINDINGS Cardiovascular: On unenhanced CT, there is no evidence of mediastinal hematoma. Preferential opacification of the thoracic aorta. No evidence of thoracic aortic aneurysm or dissection. Atherosclerotic calcifications of the aortic arch. Although not tailored for evaluation of the pulmonary arteries, there is no evidence of pulmonary embolism to the segmental level. The heart is normal in size.  No pericardial effusion. Mild coronary atherosclerosis of the LAD. Mediastinum/Nodes: No suspicious mediastinal lymphadenopathy. Visualized thyroid is unremarkable. Lungs/Pleura: Evaluation of the lung parenchyma is constrained by respiratory motion. Within that constraint, there are no suspicious pulmonary nodules. Mild bilateral lower lobe bronchiectasis with scarring. Mild eventration of the right hemidiaphragm with associated right basilar atelectasis. No focal consolidation. No pleural effusion or pneumothorax. Musculoskeletal: Degenerative changes of the thoracic spine. Review of the MIP images confirms the above findings. CTA ABDOMEN AND PELVIS FINDINGS VASCULAR Aorta: No evidence of abdominal aortic aneurysm or dissection. Patent. Atherosclerotic calcifications. Celiac: Patent. SMA: Patent.  Atherosclerotic calcifications proximally. Renals: Patent bilaterally. IMA: Patent. Inflow: Patent. Mild atherosclerotic calcifications of the bilateral internal iliac arteries. Veins: Unremarkable. Review of the MIP images confirms the above findings. NON-VASCULAR Hepatobiliary: No evidence of hepatic steatosis. However, there is hyperperfusion along the gallbladder fossa (series 5/image 93),  nonspecific and likely reflecting a benign pseudolesion in the setting of normal LFTs. Gallbladder is unremarkable. No intrahepatic or extrahepatic ductal dilatation. Pancreas: Within normal limits. Spleen: Within normal limits. Adrenals/Urinary Tract: Adrenal glands are within normal limits. Kidneys there are within normal limits.  No hydronephrosis. Bladder is within normal limits. Stomach/Bowel: Stomach is notable for a tiny hiatal hernia. No evidence of bowel obstruction. Normal appendix (series 5/image 156). Mild left  colonic diverticulosis, without evidence of diverticulitis. Lymphatic: No suspicious abdominopelvic lymphadenopathy. Mild nonspecific fat stranding in the central jejunal mesentery, without discrete lymphadenopathy. Reproductive: Prostate is unremarkable. Other: No abdominopelvic ascites. Musculoskeletal: Degenerative changes of the lumbar spine. Review of the MIP images confirms the above findings. IMPRESSION: No evidence of thoracoabdominal aortic aneurysm or dissection. No evidence of pulmonary embolism. No CT findings to account for the patient's chest/abdominal pain. Additional ancillary findings as above. Electronically Signed   By: Julian Hy M.D.   On: 05/06/2019 01:41    ASSESSMENT AND PLAN:   Active Problems:   Chest pain   Abdominal pain  1.  Chest pain: Atypical in duration and presentation.  3 troponins are negative, seen by cardiologist and suggested no further cardiac work-up and likely noncardiac origin of the pain.  Suggested to get GI consult I spoke to GI who is suggesting previous finding of esophagitis and start on PPI twice daily and sucralfate and if patient does not improve then may need EGD.  Patient's 3 troponins are negative-and the chest pain is atypical.  He is suspected to have acute cholecystitis and most likely need cholecystectomy.  Patient does not have history of coronary artery disease-currently his vitals are stable.  He is at  age-appropriate mild to moderate risk for this surgery and no further cardiac work-up needed and we can proceed for cholecystectomy as patient is having bacteremia and likely sepsis due to this infection.  2.  Sepsis,  acute cholecystitis, gram-negative bacteremia Patient had fever last night and blood cultures reported gram-negative bacteremia.  CT scan abdomen reports acute cholecystitis. Contacted general surgery and plan for cholecystectomy tonight.  Patient is optimized for surgery.  2.  Hypertension: Intermittent control; patient was on lisinopril and hydrochlorothiazide but now has slight worsening in the renal function and also required contrast for the CT scan so I would hold both of them and start on small dose metoprolol for better blood pressure control.  Also worried that with gram-negative bacteremia and fever his blood pressure might drop so I would not even for very tight blood pressure control currently.  2.  Acute renal failure This could be due to sepsis, infection and cholecystitis, IV fluids and continue to monitor.  Avoid nephrotoxic medications and diuretics.  3.  Hyperlipidemia: Continue statin therapy 4.  BPH: Continue tamsulosin 5.  Asthma: Continue inhaled corticosteroid.  Albuterol as needed. 6.  DVT prophylaxis: Lovenox 7.  GI prophylaxis: PPI BID as above.    All the records are reviewed and case discussed with Care Management/Social Workerr. Management plans discussed with the patient, family and they are in agreement.  CODE STATUS: Full.  TOTAL TIME TAKING CARE OF THIS PATIENT: 45 minutes.    POSSIBLE D/C IN 1-2 DAYS, DEPENDING ON CLINICAL CONDITION.   Vaughan Basta M.D on 05/07/2019   Between 7am to 6pm - Pager - [phone]  After 6pm go to [email or web address] - password [email or web address] [facility]  Office  [phone]  CC: Primary care physician; Juluis Pitch, MD  Note: This dictation was prepared with Dragon dictation  along with smaller phrase technology. Any transcriptional errors that result from this process are unintentional.

## 2019-05-07 NOTE — Op Note (Signed)
Hand Assisted Laparoscopic Cholecystectomy  Pre-operative Diagnosis: Cholecystitis  Post-operative Diagnosis: Gangrenous cholecystitis   Surgeon: Caroleen Hamman, MD FACS  Anesthesia: Gen. with endotracheal tube   Findings: Gangrenous cholecystitis with severe inflammatory response around the cystic duct and infundibular junction. Difficult case due to morbid obesity and small space within abdominal cavity. Hepatic asteatosis and large liver  Estimated Blood Loss: 250cc         Drains: 15 FR RUQ          Specimens: Gallbladder           Complications: none   Procedure Details  The patient was seen again in the Holding Room. The benefits, complications, treatment options, and expected outcomes were discussed with the patient. The risks of bleeding, infection, recurrence of symptoms, failure to resolve symptoms, bile duct damage, bile duct leak, retained common bile duct stone, bowel injury, any of which could require further surgery and/or ERCP, stent, or papillotomy were reviewed with the patient. The likelihood of improving the patient's symptoms with return to their baseline status is good.  The patient and/or family concurred with the proposed plan, giving informed consent.  The patient was taken to Operating Room, identified as Zachary Chase and the procedure verified as Laparoscopic Cholecystectomy.  A Time Out was held and the above information confirmed.  Prior to the induction of general anesthesia, antibiotic prophylaxis was administered. VTE prophylaxis was in place. General endotracheal anesthesia was then administered and tolerated well. After the induction, the abdomen was prepped with Chloraprep and draped in the sterile fashion. The patient was positioned in the supine position.  Cut down technique was used to enter the abdominal cavity and a Hasson trochar was placed after two vicryl stitches were anchored to the fascia. Pneumoperitoneum was then created with CO2  and tolerated well without any adverse changes in the patient's vital signs.  Three 5-mm ports were placed in the right upper quadrant all under direct vision. All skin incisions  were infiltrated with a local anesthetic agent before making the incision and placing the trocars.   The patient was positioned  in reverse Trendelenburg, tilted slightly to the patient's left.  The gallbladder was identified, the fundus grasped and retracted cephalad.  This was a very difficult case due to his morbid obesity and no good window for dissection.  Visualize the gallbladder and was severely inflamed with gangrenous features. After starting laparoscopically I quickly decided that this was going to be an extremely difficult case.  Therefore I choose to do a hand-assisted port.  The midline incision was extended to 8 cm and the GelPort was placed.  I use my hand to assist with the liver retraction as well as with the dissection.  The infundibular area was extremely inflamed therefore I decided to do a dome down technique.  This was done with electrocautery in the standard fashion.  I was able to clearly dissect with my fingers the cystic duct and infundibulum junction.  This was severely inflamed.  I also obtain the critical window of safety and clearly identified the cystic artery and cystic duct.  Because of the cystic duct being so inflamed and short I decided to actually divided using 45 mm blue load stapler taking some of the infundibulum with me to decrease the chance of common bile duct injury.  The cystic artery was double clipped and divided in the standard fashion.  Most of the blood loss was from the liver bed given the severe inflammatory response.  The gallbladder was removed and placed in an Endocatch bag. The liver bed was irrigated and inspected. Hemostasis was achieved with the electrocautery. Copious irrigation was utilized and was repeatedly aspirated until clear.  The gallbladder and Endocatch sac  were then removed    Inspection of the right upper quadrant was performed. No bleeding, bile duct injury or leak, or bowel injury was noted.  A 15 Blake drain was placed within the right upper quadrant .pneumoperitoneum was released.  All the ports were removed under direct visualization The fascia was closed with a running 0 PDS in the standard fashion and we were able to place liposomal Marcaine throughout all incision for postoperative analgesia.  Skin was closed with a staples  The patient was then extubated and brought to the recovery room in stable condition. Sponge, lap, and needle counts were correct at closure and at the conclusion of the case.               Caroleen Hamman, MD, FACS

## 2019-05-07 NOTE — Transfer of Care (Signed)
Immediate Anesthesia Transfer of Care Note  Patient: Kasten Leveque  Procedure(s) Performed: LAPAROSCOPIC CHOLECYSTECTOMY (N/A Abdomen)  Patient Location: PACU  Anesthesia Type:General  Level of Consciousness: sedated  Airway & Oxygen Therapy: Patient Spontanous Breathing and Patient connected to face mask oxygen  Post-op Assessment: Report given to RN and Post -op Vital signs reviewed and stable  Post vital signs: Reviewed and stable  Last Vitals:  Vitals Value Taken Time  BP 140/69 05/07/19 2037  Temp    Pulse 103 05/07/19 2038  Resp    SpO2 100 % 05/07/19 2038  Vitals shown include unvalidated device data.  Last Pain:  Vitals:   05/07/19 1848  TempSrc: Temporal  PainSc: 0-No pain         Complications: No apparent anesthesia complications

## 2019-05-07 NOTE — Anesthesia Post-op Follow-up Note (Signed)
Anesthesia QCDR form completed.        

## 2019-05-07 NOTE — Progress Notes (Signed)
PHARMACY - PHYSICIAN COMMUNICATION CRITICAL VALUE ALERT - BLOOD CULTURE IDENTIFICATION (BCID)  Zachary Chase is an 73 y.o. male who presented to [facility] on 05/06/2019 with a chief complaint of Chest Pain  Assessment:  Blood culture with GNR in aerobic and anaerobic bottles of one set, BCID identified [place] pneumo  Name of physician (or Provider) Contacted: Anselm Jungling  Current antibiotics: Zosyn for intra-abdominal infection  Changes to prescribed antibiotics recommended:  Recommendations accepted by provider  Changed antibiotics to Ceftriaxone 2g IV q24h and Metronidazole 500mg  IV q8h.  Results for orders placed or performed during the hospital encounter of 05/06/19  Blood Culture ID Panel (Reflexed) (Collected: 05/07/2019  1:03 AM)  Result Value Ref Range   Enterococcus species NOT DETECTED NOT DETECTED   Listeria monocytogenes NOT DETECTED NOT DETECTED   Staphylococcus species NOT DETECTED NOT DETECTED   Staphylococcus aureus (BCID) NOT DETECTED NOT DETECTED   Streptococcus species NOT DETECTED NOT DETECTED   Streptococcus agalactiae NOT DETECTED NOT DETECTED   Streptococcus pneumoniae NOT DETECTED NOT DETECTED   Streptococcus pyogenes NOT DETECTED NOT DETECTED   Acinetobacter baumannii NOT DETECTED NOT DETECTED   Enterobacteriaceae species DETECTED (A) NOT DETECTED   Enterobacter cloacae complex NOT DETECTED NOT DETECTED   Escherichia coli NOT DETECTED NOT DETECTED   Klebsiella oxytoca NOT DETECTED NOT DETECTED   Klebsiella pneumoniae DETECTED (A) NOT DETECTED   Proteus species NOT DETECTED NOT DETECTED   Serratia marcescens NOT DETECTED NOT DETECTED   Carbapenem resistance NOT DETECTED NOT DETECTED   Haemophilus influenzae NOT DETECTED NOT DETECTED   Neisseria meningitidis NOT DETECTED NOT DETECTED   Pseudomonas aeruginosa NOT DETECTED NOT DETECTED   Candida albicans NOT DETECTED NOT DETECTED   Candida glabrata NOT DETECTED NOT DETECTED   Candida krusei NOT DETECTED  NOT DETECTED   Candida parapsilosis NOT DETECTED NOT DETECTED   Candida tropicalis NOT DETECTED NOT DETECTED    Paulina Fusi, PharmD, BCPS 05/07/2019 1:40 PM

## 2019-05-08 ENCOUNTER — Encounter: Payer: Self-pay | Admitting: Surgery

## 2019-05-08 DIAGNOSIS — K82A1 Gangrene of gallbladder in cholecystitis: Secondary | ICD-10-CM

## 2019-05-08 DIAGNOSIS — I10 Essential (primary) hypertension: Secondary | ICD-10-CM

## 2019-05-08 DIAGNOSIS — R7881 Bacteremia: Secondary | ICD-10-CM

## 2019-05-08 DIAGNOSIS — M159 Polyosteoarthritis, unspecified: Secondary | ICD-10-CM

## 2019-05-08 DIAGNOSIS — Z87891 Personal history of nicotine dependence: Secondary | ICD-10-CM

## 2019-05-08 DIAGNOSIS — E785 Hyperlipidemia, unspecified: Secondary | ICD-10-CM

## 2019-05-08 DIAGNOSIS — Z79899 Other long term (current) drug therapy: Secondary | ICD-10-CM

## 2019-05-08 DIAGNOSIS — B961 Klebsiella pneumoniae [K. pneumoniae] as the cause of diseases classified elsewhere: Secondary | ICD-10-CM

## 2019-05-08 DIAGNOSIS — K8010 Calculus of gallbladder with chronic cholecystitis without obstruction: Secondary | ICD-10-CM

## 2019-05-08 DIAGNOSIS — Z9049 Acquired absence of other specified parts of digestive tract: Secondary | ICD-10-CM

## 2019-05-08 DIAGNOSIS — R1010 Upper abdominal pain, unspecified: Secondary | ICD-10-CM

## 2019-05-08 DIAGNOSIS — E119 Type 2 diabetes mellitus without complications: Secondary | ICD-10-CM

## 2019-05-08 DIAGNOSIS — N400 Enlarged prostate without lower urinary tract symptoms: Secondary | ICD-10-CM

## 2019-05-08 DIAGNOSIS — Z881 Allergy status to other antibiotic agents status: Secondary | ICD-10-CM

## 2019-05-08 LAB — COMPREHENSIVE METABOLIC PANEL
ALT: 65 U/L — ABNORMAL HIGH (ref 0–44)
AST: 72 U/L — ABNORMAL HIGH (ref 15–41)
Albumin: 2.9 g/dL — ABNORMAL LOW (ref 3.5–5.0)
Alkaline Phosphatase: 63 U/L (ref 38–126)
Anion gap: 7 (ref 5–15)
BUN: 21 mg/dL (ref 8–23)
CO2: 22 mmol/L (ref 22–32)
Calcium: 7.5 mg/dL — ABNORMAL LOW (ref 8.9–10.3)
Chloride: 108 mmol/L (ref 98–111)
Creatinine, Ser: 1.43 mg/dL — ABNORMAL HIGH (ref 0.61–1.24)
GFR calc Af Amer: 56 mL/min — ABNORMAL LOW (ref >60)
GFR calc non Af Amer: 48 mL/min — ABNORMAL LOW (ref >60)
Glucose, Bld: 181 mg/dL — ABNORMAL HIGH (ref 70–99)
Potassium: 4.1 mmol/L (ref 3.5–5.1)
Sodium: 137 mmol/L (ref 135–145)
Total Bilirubin: 0.6 mg/dL (ref 0.3–1.2)
Total Protein: 6.3 g/dL — ABNORMAL LOW (ref 6.5–8.1)

## 2019-05-08 LAB — CBC
HCT: 35.5 % — ABNORMAL LOW (ref 39.0–52.0)
Hemoglobin: 11.8 g/dL — ABNORMAL LOW (ref 13.0–17.0)
MCH: 30.3 pg (ref 26.0–34.0)
MCHC: 33.2 g/dL (ref 30.0–36.0)
MCV: 91.3 fL (ref 80.0–100.0)
Platelets: 139 10*3/uL — ABNORMAL LOW (ref 150–400)
RBC: 3.89 MIL/uL — ABNORMAL LOW (ref 4.22–5.81)
RDW: 14.4 % (ref 11.5–15.5)
WBC: 9.2 10*3/uL (ref 4.0–10.5)
nRBC: 0 % (ref 0.0–0.2)

## 2019-05-08 NOTE — Progress Notes (Signed)
Zachary Darby, MD [address]  [place]  [place], [place] [address]  Main: [phone]  Fax: [phone] Pager: [phone]   Subjective: Patient underwent cholecystectomy, has drain placement.  He denies fever, chills, nausea or vomiting.  He tolerated full liquid diet well Transaminases are mildly   Objective: Vital signs in last 24 hours: Vitals:   05/08/19 0416 05/08/19 0446 05/08/19 0641 05/08/19 0749  BP: 136/74  122/84 137/77  Pulse: 80  80 82  Resp: 16  (!) 24 (!) 22  Temp: 99.3 F (37.4 C)  98.3 F (36.8 C) 98.7 F (37.1 C)  TempSrc: Oral  Oral Oral  SpO2: 96%  97% 98%  Weight:  110 kg    Height:       Weight change: 0 kg  Intake/Output Summary (Last 24 hours) at 05/08/2019 1612 Last data filed at 05/08/2019 1403 Gross per 24 hour  Intake 3050 ml  Output 1670 ml  Net 1380 ml     Exam: Heart:: Regular rate and rhythm or S1S2 present Lungs: normal and clear to auscultation Abdomen: soft, mild right upper quadrant tenderness, normal bowel sounds   Lab Results: CBC Latest Ref Rng & Units 05/08/2019 05/07/2019 05/05/2019  WBC 4.0 - 10.5 K/uL 9.2 10.7(H) 9.0  Hemoglobin 13.0 - 17.0 g/dL 11.8(L) 13.0 13.7  Hematocrit 39.0 - 52.0 % 35.5(L) 39.3 41.0  Platelets 150 - 400 K/uL 139(L) 136(L) 213   CMP Latest Ref Rng & Units 05/08/2019 05/07/2019 05/05/2019  Glucose 70 - 99 mg/dL 181(H) 179(H) 160(H)  BUN 8 - 23 mg/dL 21 25(H) 19  Creatinine 0.61 - 1.24 mg/dL 1.43(H) 1.57(H) 1.17  Sodium 135 - 145 mmol/L 137 135 137  Potassium 3.5 - 5.1 mmol/L 4.1 4.1 4.6  Chloride 98 - 111 mmol/L 108 103 103  CO2 22 - 32 mmol/L 22 23 25   Calcium 8.9 - 10.3 mg/dL 7.5(L) 7.9(L) 9.0  Total Protein 6.5 - 8.1 g/dL 6.3(L) 6.5 7.9  Total Bilirubin 0.3 - 1.2 mg/dL 0.6 1.0 0.5  Alkaline Phos 38 - 126 U/L 63 67 99  AST 15 - 41 U/L 72(H) 25 23  ALT 0 - 44 U/L 65(H) 26 24    Micro Results: Recent Results (from the past 240 hour(s))  SARS Coronavirus 2 (CEPHEID -  Performed in [facility] hospital lab), Hosp Order     Status: None   Collection Time: 05/06/19  4:58 AM   Specimen: Nasopharyngeal Swab  Result Value Ref Range Status   SARS Coronavirus 2 NEGATIVE NEGATIVE Final    Comment: (NOTE) If result is NEGATIVE SARS-CoV-2 target nucleic acids are NOT DETECTED. The SARS-CoV-2 RNA is generally detectable in upper and lower  respiratory specimens during the acute phase of infection. The lowest  concentration of SARS-CoV-2 viral copies this assay can detect is 250  copies / mL. A negative result does not preclude SARS-CoV-2 infection  and should not be used as the sole basis for treatment or other  patient management decisions.  A negative result may occur with  improper specimen collection / handling, submission of specimen other  than nasopharyngeal swab, presence of viral mutation(s) within the  areas targeted by this assay, and inadequate number of viral copies  (<250 copies / mL). A negative result must be combined with clinical  observations, patient history, and epidemiological information. If result is POSITIVE SARS-CoV-2 target nucleic acids are DETECTED. The SARS-CoV-2 RNA is generally detectable in upper and lower  respiratory specimens dur  ing the acute phase of infection.  Positive  results are indicative of active infection with SARS-CoV-2.  Clinical  correlation with patient history and other diagnostic information is  necessary to determine patient infection status.  Positive results do  not rule out bacterial infection or co-infection with other viruses. If result is PRESUMPTIVE POSTIVE SARS-CoV-2 nucleic acids MAY BE PRESENT.   A presumptive positive result was obtained on the submitted specimen  and confirmed on repeat testing.  While 2019 novel coronavirus  (SARS-CoV-2) nucleic acids may be present in the submitted sample  additional confirmatory testing may be necessary for epidemiological  and / or clinical management  purposes  to differentiate between  SARS-CoV-2 and other Sarbecovirus currently known to infect humans.  If clinically indicated additional testing with an alternate test  methodology [phone]) is advised. The SARS-CoV-2 RNA is generally  detectable in upper and lower respiratory sp ecimens during the acute  phase of infection. The expected result is Negative. Fact Sheet for Patients:  StrictlyIdeas.no Fact Sheet for Healthcare Providers: BankingDealers.co.za This test is not yet approved or cleared by the [place] FDA and has been authorized for detection and/or diagnosis of SARS-CoV-2 by FDA under an Emergency Use Authorization (EUA).  This EUA will remain in effect (meaning this test can be used) for the duration of the COVID-19 declaration under Section 564(b)(1) of the Act, 21 U.S.C. section 360bbb-3(b)(1), unless the authorization is terminated or revoked sooner. Performed at [facility], [address] [place], [place] [address]   SARS Coronavirus 2 (CEPHEID- Performed in [facility] hospital lab), Hosp Order     Status: None   Collection Time: 05/06/19 11:35 PM   Specimen: Nasopharyngeal Swab  Result Value Ref Range Status   SARS Coronavirus 2 NEGATIVE NEGATIVE Final    Comment: (NOTE) If result is NEGATIVE SARS-CoV-2 target nucleic acids are NOT DETECTED. The SARS-CoV-2 RNA is generally detectable in upper and lower  respiratory specimens during the acute phase of infection. The lowest  concentration of SARS-CoV-2 viral copies this assay can detect is 250  copies / mL. A negative result does not preclude SARS-CoV-2 infection  and should not be used as the sole basis for treatment or other  patient management decisions.  A negative result may occur with  improper specimen collection / handling, submission of specimen other  than nasopharyngeal swab, presence of viral mutation(s) within the  areas targeted by  this assay, and inadequate number of viral copies  (<250 copies / mL). A negative result must be combined with clinical  observations, patient history, and epidemiological information. If result is POSITIVE SARS-CoV-2 target nucleic acids are DETECTED. The SARS-CoV-2 RNA is generally detectable in upper and lower  respiratory specimens dur ing the acute phase of infection.  Positive  results are indicative of active infection with SARS-CoV-2.  Clinical  correlation with patient history and other diagnostic information is  necessary to determine patient infection status.  Positive results do  not rule out bacterial infection or co-infection with other viruses. If result is PRESUMPTIVE POSTIVE SARS-CoV-2 nucleic acids MAY BE PRESENT.   A presumptive positive result was obtained on the submitted specimen  and confirmed on repeat testing.  While 2019 novel coronavirus  (SARS-CoV-2) nucleic acids may be present in the submitted sample  additional confirmatory testing may be necessary for epidemiological  and / or clinical management purposes  to differentiate between  SARS-CoV-2 and other Sarbecovirus currently known to infect humans.  If clinically indicated  additional testing with an alternate test  methodology [phone]) is advised. The SARS-CoV-2 RNA is generally  detectable in upper and lower respiratory sp ecimens during the acute  phase of infection. The expected result is Negative. Fact Sheet for Patients:  StrictlyIdeas.no Fact Sheet for Healthcare Providers: BankingDealers.co.za This test is not yet approved or cleared by the [place] FDA and has been authorized for detection and/or diagnosis of SARS-CoV-2 by FDA under an Emergency Use Authorization (EUA).  This EUA will remain in effect (meaning this test can be used) for the duration of the COVID-19 declaration under Section 564(b)(1) of the Act, 21 U.S.C. section  360bbb-3(b)(1), unless the authorization is terminated or revoked sooner. Performed at [facility], [address] [place], [place] [address]   Culture, blood (Routine X 2) w Reflex to ID Panel     Status: Abnormal (Preliminary result)   Collection Time: 05/07/19  1:03 AM   Specimen: BLOOD  Result Value Ref Range Status   Specimen Description   Final    BLOOD LEFT ANTECUBITAL Performed at [facility], [address] [place], [place] [address]    Special Requests   Final    BOTTLES DRAWN AEROBIC AND ANAEROBIC Blood Culture adequate volume Performed at [facility], [address], [place], [place] [address]    Culture  Setup Time   Final    GRAM NEGATIVE RODS IN BOTH AEROBIC AND ANAEROBIC BOTTLES CRITICAL RESULT CALLED TO, READ BACK BY AND VERIFIED WITH: LISA KLUTTZ AT 1243 05/07/2019 SDR Performed at [facility], [place] [address], [place], [place] [address]    Culture KLEBSIELLA PNEUMONIAE (A)  Final   Report Status PENDING  Incomplete  Culture, blood (Routine X 2) w Reflex to ID Panel     Status: Abnormal (Preliminary result)   Collection Time: 05/07/19  1:03 AM   Specimen: BLOOD LEFT HAND  Result Value Ref Range Status   Specimen Description   Final    BLOOD LEFT HAND Performed at [facility], [place] [address] [place], [place] [address]    Special Requests   Final    BOTTLES DRAWN AEROBIC AND ANAEROBIC Blood Culture adequate volume Performed at [facility], [address] [place], [place] [address]    Culture  Setup Time   Final    GRAM NEGATIVE RODS AEROBIC BOTTLE ONLY CRITICAL VALUE NOTED.  VALUE IS CONSISTENT WITH PREVIOUSLY REPORTED AND CALLED VALUE. Performed at [facility], [address] [place], [place] [address]    Culture KLEBSIELLA PNEUMONIAE (A)  Final   Report Status PENDING  Incomplete  Blood Culture ID Panel (Reflexed)     Status: Abnormal   Collection Time: 05/07/19  1:03 AM  Result  Value Ref Range Status   Enterococcus species NOT DETECTED NOT DETECTED Final   Listeria monocytogenes NOT DETECTED NOT DETECTED Final   Staphylococcus species NOT DETECTED NOT DETECTED Final   Staphylococcus aureus (BCID) NOT DETECTED NOT DETECTED Final   Streptococcus species NOT DETECTED NOT DETECTED Final   Streptococcus agalactiae NOT DETECTED NOT DETECTED Final   Streptococcus pneumoniae NOT DETECTED NOT DETECTED Final   Streptococcus pyogenes NOT DETECTED NOT DETECTED Final   Acinetobacter baumannii NOT DETECTED NOT DETECTED Final   Enterobacteriaceae species DETECTED (A) NOT DETECTED Final    Comment: Enterobacteriaceae represent a large family of gram-negative bacteria, not a single organism. CRITICAL RESULT CALLED TO, READ BACK BY AND VERIFIED WITH:  LISA KLUTTZ AT 1243 05/07/2019  SDR    Enterobacter cloacae complex NOT DETECTED NOT DETECTED Final   Escherichia coli NOT DETECTED NOT DETECTED Final   Klebsiella oxytoca NOT DETECTED NOT DETECTED Final   Klebsiella pneumoniae DETECTED (A) NOT DETECTED Final    Comment: CRITICAL RESULT CALLED TO, READ BACK BY AND VERIFIED WITH:  LISA KLUTTZ AT 1243 05/07/2019 SDR    Proteus species NOT DETECTED NOT DETECTED Final   Serratia marcescens NOT DETECTED NOT DETECTED Final   Carbapenem resistance NOT DETECTED NOT DETECTED Final   Haemophilus influenzae NOT DETECTED NOT DETECTED Final   Neisseria meningitidis NOT DETECTED NOT DETECTED Final   Pseudomonas aeruginosa NOT DETECTED NOT DETECTED Final   Candida albicans NOT DETECTED NOT DETECTED Final   Candida glabrata NOT DETECTED NOT DETECTED Final   Candida krusei NOT DETECTED NOT DETECTED Final   Candida parapsilosis NOT DETECTED NOT DETECTED Final   Candida tropicalis NOT DETECTED NOT DETECTED Final    Comment: Performed at [facility], [address], [place], [place] [address]   Studies/Results: Ct Abdomen Pelvis W Contrast  Result Date: 05/07/2019 CLINICAL DATA:   Epigastric abdominal pain for 2-3 days.  Sepsis. EXAM: CT ABDOMEN AND PELVIS WITH CONTRAST TECHNIQUE: Multidetector CT imaging of the abdomen and pelvis was performed using the standard protocol following bolus administration of intravenous contrast. CONTRAST:  75 mL OMNIPAQUE IOHEXOL 300 MG/ML  SOLN COMPARISON:  CT abdomen and pelvis 12/06/2017. CT chest, abdomen and pelvis 05/06/2019. FINDINGS: Lower chest: No pleural or pericardial effusion. Mild bronchiectasis in the right lower lobe is unchanged. There is some dependent atelectasis. Lung bases otherwise clear. Hepatobiliary: There appears to be sludge or a few small stones in the gallbladder. Mild stranding is seen about the inferior aspect of the gallbladder. Liver and biliary tree appear normal. Pancreas: Unremarkable. No pancreatic ductal dilatation or surrounding inflammatory changes. Spleen: Normal in size without focal abnormality. Adrenals/Urinary Tract: Adrenal glands are unremarkable. Kidneys are normal, without renal calculi, focal lesion, or hydronephrosis. There is some contrast material in the urinary bladder from the patient's CT scan yesterday bladder is otherwise unremarkable. Stomach/Bowel: Stomach is within normal limits. Appendix appears normal. Walls of the first portion of the duodenum are thickened but it is incompletely distended. It is partially distended on yesterday's examination and wall thickening is less apparent on that study. Small bowel is otherwise unremarkable. Scattered diverticulosis without diverticulitis noted. Vascular/Lymphatic: Aortic atherosclerosis. No enlarged abdominal or pelvic lymph nodes. Reproductive: Prostatomegaly noted. Other: Small fat containing umbilical and bilateral inguinal hernias are seen. Musculoskeletal: No fracture or focal lesion. Degenerative disease about the hips and lumbar spine noted. IMPRESSION: Stones or sludge within the gallbladder. There is stranding about the gallbladder and first  portion of the duodenum which could be due to cholecystitis or duodenitis. Walls of the first portion the duodenum are thickened but the it is under distended on today's study. Mild thickening is seen on yesterday's exam. Right upper quadrant ultrasound could be used for further evaluation of the gallbladder. Atherosclerosis. Diverticulosis without diverticulitis. Small fat containing umbilical and bilateral inguinal hernias. Electronically Signed   By: Inge Rise M.D.   On: 05/07/2019 14:04   US Abdomen Limited Ruq  Result Date: 05/07/2019 CLINICAL DATA:  New pericholecystic inflammatory change on recent CT examination. EXAM: ULTRASOUND ABDOMEN LIMITED RIGHT UPPER QUADRANT COMPARISON:  None. FINDINGS: Gallbladder: Gallbladder is well distended with evidence of gallstones. Negative sonographic Murphy's sign is noted. No significant pericholecystic fluid or wall thickening is noted. Common bile duct:  Diameter: 4.4 mm. Liver: Increased in echogenicity consistent with fatty infiltration. No focal mass is noted. Portal vein is patent on color Doppler imaging with normal direction of blood flow towards the liver. IMPRESSION: Cholelithiasis is noted with a negative sonographic Murphy sign. No pericholecystic fluid is noted. Fatty liver. Electronically Signed   By: Inez Catalina M.D.   On: 05/07/2019 16:30   Medications:  I have reviewed the patient's current medications. Prior to Admission:  Medications Prior to Admission  Medication Sig Dispense Refill Last Dose   acetaminophen (TYLENOL) 500 MG tablet Take 500 mg by mouth every 6 (six) hours as needed for mild pain.    prn at prn   albuterol (PROVENTIL HFA;VENTOLIN HFA) 108 (90 Base) MCG/ACT inhaler Inhale 1 puff into the lungs every 4 (four) hours as needed for wheezing or shortness of breath.   prn at prn   augmented betamethasone dipropionate (DIPROLENE-AF) 0.05 % ointment Apply 1 application topically 2 (two) times daily.   Past Week at Unknown  time   famotidine (PEPCID) 20 MG tablet Take 20 mg by mouth at bedtime as needed for heartburn.    prn at prn   ferrous sulfate 325 (65 FE) MG tablet Take 325 mg by mouth daily with breakfast.   Past Week at Unknown time   fexofenadine (ALLEGRA) 180 MG tablet Take 180 mg by mouth daily.   Past Week at Unknown time   finasteride (PROSCAR) 5 MG tablet Take 5 mg by mouth daily.   Past Week at Unknown time   fluticasone (FLONASE) 50 MCG/ACT nasal spray Place 2 sprays into both nostrils daily as needed for allergies or rhinitis.    prn at prn   glucosamine-chondroitin 500-400 MG tablet Take 1 tablet by mouth daily.    Past Week at Unknown time   hydrocortisone cream 1 % Apply 1 application topically 2 (two) times daily.   Past Week at Unknown time   ipratropium (ATROVENT) 0.06 % nasal spray Place 2 sprays into both nostrils 3 (three) times daily.    prn at prn   lisinopril-hydrochlorothiazide (PRINZIDE,ZESTORETIC) 20-12.5 MG tablet Take 1 tablet by mouth daily.    Past Week at Unknown time   Melatonin 10 MG TABS Take 1 tablet by mouth at bedtime.   Past Week at Unknown time   naproxen sodium (ALEVE) 220 MG tablet Take 220 mg by mouth 2 (two) times daily with a meal.    Past Week at Unknown time   tamsulosin (FLOMAX) 0.4 MG CAPS capsule Take 0.4 mg by mouth 2 (two) times daily.   Past Week at Unknown time   WIXELA INHUB 250-50 MCG/DOSE AEPB Inhale 1 puff into the lungs 2 (two) times a day.   Past Week at Unknown time   Scheduled:  acetaminophen  1,000 mg Oral Q6H   docusate sodium  100 mg Oral BID   enoxaparin (LOVENOX) injection  40 mg Subcutaneous Q12H   ferrous sulfate  325 mg Oral Q breakfast   finasteride  5 mg Oral Daily   ketorolac  15 mg Intravenous Q6H   loratadine  10 mg Oral Daily   Melatonin  10 mg Oral QHS   metoprolol tartrate  12.5 mg Oral BID   mometasone-formoterol  2 puff Inhalation BID   pantoprazole (PROTONIX) IV  40 mg Intravenous Q12H   sucralfate   1 g Oral TID WC & HS   tamsulosin  0.4 mg Oral BID   Continuous:  lactated ringers 100 mL/hr  at 05/08/19 1142   piperacillin-tazobactam 3.375 g (05/08/19 1234)   LZJ:QBHALPFXT, famotidine, fluticasone, ipratropium, morphine injection, ondansetron **OR** ondansetron (ZOFRAN) IV, oxyCODONE Anti-infectives (From admission, onward)   Start     Dose/Rate Route Frequency Ordered Stop   05/07/19 1530  piperacillin-tazobactam (ZOSYN) IVPB 3.375 g     3.375 g 12.5 mL/hr over 240 Minutes Intravenous Every 8 hours 05/07/19 1520     05/07/19 1500  cefTRIAXone (ROCEPHIN) 2 g in sodium chloride 0.9 % 100 mL IVPB  Status:  Discontinued     2 g 200 mL/hr over 30 Minutes Intravenous Every 24 hours 05/07/19 1325 05/07/19 1520   05/07/19 1400  metroNIDAZOLE (FLAGYL) IVPB 500 mg  Status:  Discontinued     500 mg 100 mL/hr over 60 Minutes Intravenous Every 8 hours 05/07/19 1325 05/07/19 1520   05/07/19 1045  piperacillin-tazobactam (ZOSYN) IVPB 3.375 g  Status:  Discontinued     3.375 g 12.5 mL/hr over 240 Minutes Intravenous Every 8 hours 05/07/19 1037 05/07/19 1325     Scheduled Meds:  acetaminophen  1,000 mg Oral Q6H   docusate sodium  100 mg Oral BID   enoxaparin (LOVENOX) injection  40 mg Subcutaneous Q12H   ferrous sulfate  325 mg Oral Q breakfast   finasteride  5 mg Oral Daily   ketorolac  15 mg Intravenous Q6H   loratadine  10 mg Oral Daily   Melatonin  10 mg Oral QHS   metoprolol tartrate  12.5 mg Oral BID   mometasone-formoterol  2 puff Inhalation BID   pantoprazole (PROTONIX) IV  40 mg Intravenous Q12H   sucralfate  1 g Oral TID WC & HS   tamsulosin  0.4 mg Oral BID   Continuous Infusions:  lactated ringers 100 mL/hr at 05/08/19 1142   piperacillin-tazobactam 3.375 g (05/08/19 1234)   PRN Meds:.albuterol, famotidine, fluticasone, ipratropium, morphine injection, ondansetron **OR** ondansetron (ZOFRAN) IV, oxyCODONE   Assessment: Active Problems:   Chest pain    Abdominal pain  Acute cholecystitis status post cholecystectomy  Plan: Continue Protonix 40 mg p.o. daily upon discharge Advance diet as tolerated Follow-up with GI as outpatient No further work-up from GI standpoint at this time   LOS: 1 day   Tillman Kazmierski 05/08/2019, 4:12 PM

## 2019-05-08 NOTE — Progress Notes (Signed)
MD notified of 5 beat vtach run. VSS. Will continue to monitor. Currently NSR 80bpm.

## 2019-05-08 NOTE — Consult Note (Signed)
NAME: Zachary Chase  DOB: 05-30-1946  MRN: [id number]  Date/Time: 05/08/2019 4:28 PM  REQUESTING PROVIDER: Anselm Jungling Subjective:  REASON FOR CONSULT: Klebsiella bacteremia ? Zachary Chase is a 73 y.o. male with a history of hypertension, hyperlipidemia, presented on 05/05/2019 at 9:30 PM with abdominal pain which began pretty quickly that evening.  Patient said that he was actually working on the computer when he felt a pain in the right upper quadrant and epigastrium and initially thought it was hunger but try to eat something did not feel better and it made it worse.  He had no nausea or vomiting but as the pain is been getting worse he came to the hospital.  He did not have any fever at home. In the ED his blood pressure was 170/78, heart rate of 78, respiratory rate of 16, and temperature of 98.4.  He weighed 114 kg.  EKG would not show any evidence of ischemia.  He had CT of the abdomen and pelvis and that was reported as hypoperfusion along the gallbladder fossa.  He had ultrasound of the gallbladder and that revealed increased echogenicity consistent with fatty infiltration of the liver and cholelithiasis noted with negative Murphy sign.  There was no pericholecystic fluid seen.  The patient was initially atypical with normal WBC and normal labs.  So he was not started on antibiotics in the beginning.  He was also seen by GI and they treated him for GERD and esophagitis with PPI twice daily and Carafate.  He had a fever of 102.3 on 05/06/2019.  He was started on Zosyn on 05/07/2019.  He was seen by surgeon Dr. Dahlia Byes who on examination felt he was very tender in the right upper quadrant and epigastric area.  He took him for a laparoscopy cholecystectomy and possible cholangiogram on 05/07/2019.  Surgical findings were a gangrenous cholecystitis with severe inflammatory response around the cystic duct and infundibular junction..  Blood culture that was sent on 05/07/2019 morning showed Klebsiella  pneumoniae.  I am asked to see the patient for the same.  Currently patient is feeling much better.  He is having liquid diet. Past Medical History:  Diagnosis Date  . Anemia   . Arthritis   . Asthma   . Back pain   . BPH (benign prostatic hyperplasia)   . Dermatophytosis of nail   . Enthesopathy of hip region   . Gastric ulcer   . H/O multiple allergies   . History of colonic polyps   . Hx of onychia and paronychia   . Hypersomnia   . Hypertension   . Lower extremity edema   . Pneumonia   . Prostatic hypertrophy   . Pure hypercholesterolemia   . Sleep apnea   . Testicular hypofunction   . Tremor    of the head  . Wheezing     Past Surgical History:  Procedure Laterality Date  . CATARACT EXTRACTION W/PHACO Right 09/01/2016   Procedure: CATARACT EXTRACTION PHACO AND INTRAOCULAR LENS PLACEMENT (IOC);  Surgeon: Birder Robson, MD;  Location: [facility] ORS;  Service: Ophthalmology;  Laterality: Right;  Lot# [id number] H [place]: 00:34.0 AP%:21.8 CDE: 7.39   . CHOLECYSTECTOMY N/A 05/07/2019   Procedure: LAPAROSCOPIC CHOLECYSTECTOMY;  Surgeon: Jules Husbands, MD;  Location: [facility] ORS;  Service: General;  Laterality: N/A;  . COLONOSCOPY WITH PROPOFOL N/A 11/30/2018   Procedure: COLONOSCOPY WITH PROPOFOL;  Surgeon: Manya Silvas, MD;  Location: [facility] ENDOSCOPY;  Service: Endoscopy;  Laterality: N/A;  . ESOPHAGOGASTRODUODENOSCOPY (EGD) WITH PROPOFOL  N/A 11/30/2018   Procedure: ESOPHAGOGASTRODUODENOSCOPY (EGD) WITH PROPOFOL;  Surgeon: Manya Silvas, MD;  Location: [facility] END[facility]COPY;  Service: Endoscopy;  Laterality: N/A;  . sleep apnea surgery    . TONSILLECTOMY    Obstructive sleep apnea surgery Social History   Socioeconomic History  . Marital status: Married    Spouse name: Not on file  . Number of children: Not on file  . Years of education: Not on file  . Highest education level: Not on file  Occupational History  . Not on file  Social Needs  . Financial resource strain: Not on file   . Food insecurity    Worry: Not on file    Inability: Not on file  . Transportation needs    Medical: Not on file    Non-medical: Not on file  Tobacco Use  . Smoking status: Former Research scientist (life sciences)  . Smokeless tobacco: Never Used  Substance and Sexual Activity  . Alcohol use: Yes    Comment: occasional none last 24hrs  . Drug use: Not Currently  . Sexual activity: Not on file  Lifestyle  . Physical activity    Days per week: Not on file    Minutes per session: Not on file  . Stress: Not on file  Relationships  . Social Herbalist on phone: Not on file    Gets together: Not on file    Attends religious service: Not on file    Active member of club or organization: Not on file    Attends meetings of clubs or organizations: Not on file    Relationship status: Not on file  . Intimate partner violence    Fear of current or ex partner: Not on file    Emotionally abused: Not on file    Physically abused: Not on file    Forced sexual activity: Not on file  Other Topics Concern  . Not on file  Social History Narrative  . Not on file    No family history on file. Allergies  Allergen Reactions  . Erythromycin Nausea And Vomiting  . Sulfa Antibiotics Other (See Comments)    unknown  . Tetracyclines & Related Nausea And Vomiting    ? Current Facility-Administered Medications  Medication Dose Route Frequency Provider Last Rate Last Dose  . acetaminophen (TYLENOL) tablet 1,000 mg  1,000 mg Oral Q6H Pabon, Diego F, MD   1,000 mg at 05/08/19 0932  . albuterol (PROVENTIL) (2.5 MG/3ML) 0.083% nebulizer solution 2.5 mg  2.5 mg Nebulization Q4H PRN Pabon, Diego F, MD   2.5 mg at 05/07/19 1814  . docusate sodium (COLACE) capsule 100 mg  100 mg Oral BID Caroleen Hamman F, MD   100 mg at 05/08/19 0932  . enoxaparin (LOVENOX) injection 40 mg  40 mg Subcutaneous Q12H Pabon, [place] F, MD   40 mg at 05/08/19 0932  . famotidine (PEPCID) tablet 20 mg  20 mg Oral QHS PRN Pabon, Diego F, MD       . ferrous sulfate tablet 325 mg  325 mg Oral Q breakfast Pabon, [place] F, MD   325 mg at 05/08/19 0826  . finasteride (PROSCAR) tablet 5 mg  5 mg Oral Daily Pabon, [place] F, MD   5 mg at 05/08/19 0932  . fluticasone (FLONASE) 50 MCG/ACT nasal spray 2 spray  2 spray Each Nare Daily PRN Pabon, Diego F, MD      . ipratropium (ATROVENT) 0.06 % nasal spray 2 spray  2 spray  Each Nare TID PRN Pabon, Diego F, MD      . ketorolac (TORADOL) 15 MG/ML injection 15 mg  15 mg Intravenous Q6H Pabon, Diego F, MD   15 mg at 05/08/19 0826  . lactated ringers infusion   Intravenous Continuous Pabon, [place] F, MD 100 mL/hr at 05/08/19 1142    . loratadine (CLARITIN) tablet 10 mg  10 mg Oral Daily Pabon, [place] F, MD   10 mg at 05/08/19 0932  . Melatonin TABS 10 mg  10 mg Oral QHS Jules Husbands, MD   Stopped at 05/07/19 2246  . metoprolol tartrate (LOPRESSOR) tablet 12.5 mg  12.5 mg Oral BID Dahlia Byes, [place] F, MD   12.5 mg at 05/08/19 0932  . mometasone-formoterol (DULERA) 200-5 MCG/ACT inhaler 2 puff  2 puff Inhalation BID Jules Husbands, MD   2 puff at 05/08/19 0828  . morphine 2 MG/ML injection 2 mg  2 mg Intravenous Q2H PRN Pabon, Diego F, MD      . ondansetron (ZOFRAN) tablet 4 mg  4 mg Oral Q6H PRN Pabon, Diego F, MD       Or  . ondansetron (ZOFRAN) injection 4 mg  4 mg Intravenous Q6H PRN Jules Husbands, MD   4 mg at 05/07/19 1914  . oxyCODONE (Oxy IR/ROXICODONE) immediate release tablet 5 mg  5 mg Oral Q3H PRN Pabon, Diego F, MD      . pantoprazole (PROTONIX) injection 40 mg  40 mg Intravenous Q12H Pabon, Diego F, MD   40 mg at 05/08/19 0932  . piperacillin-tazobactam (ZOSYN) IVPB 3.375 g  3.375 g Intravenous Q8H Pabon, Diego F, MD 12.5 mL/hr at 05/08/19 1234 3.375 g at 05/08/19 1234  . sucralfate (CARAFATE) tablet 1 g  1 g Oral TID WC & HS Pabon, Diego F, MD   1 g at 05/08/19 1234  . tamsulosin (FLOMAX) capsule 0.4 mg  0.4 mg Oral BID Caroleen Hamman F, MD   0.4 mg at 05/08/19 0932     Abtx:  Anti-infectives  (From admission, onward)   Start     Dose/Rate Route Frequency Ordered Stop   05/07/19 1530  piperacillin-tazobactam (ZOSYN) IVPB 3.375 g     3.375 g 12.5 mL/hr over 240 Minutes Intravenous Every 8 hours 05/07/19 1520     05/07/19 1500  cefTRIAXone (ROCEPHIN) 2 g in sodium chloride 0.9 % 100 mL IVPB  Status:  Discontinued     2 g 200 mL/hr over 30 Minutes Intravenous Every 24 hours 05/07/19 1325 05/07/19 1520   05/07/19 1400  metroNIDAZOLE (FLAGYL) IVPB 500 mg  Status:  Discontinued     500 mg 100 mL/hr over 60 Minutes Intravenous Every 8 hours 05/07/19 1325 05/07/19 1520   05/07/19 1045  piperacillin-tazobactam (ZOSYN) IVPB 3.375 g  Status:  Discontinued     3.375 g 12.5 mL/hr over 240 Minutes Intravenous Every 8 hours 05/07/19 1037 05/07/19 1325      REVIEW OF SYSTEMS:  Const: fever, negative chills, negative weight loss Eyes: negative diplopia or visual changes, negative eye pain ENT: negative coryza, negative sore throat Resp: negative cough, hemoptysis, dyspnea Cards: negative for chest pain, palpitations, lower extremity edema GU: negative for frequency, dysuria and hematuria GI: As above.  No diarrhea or constipation no nausea vomiting. Skin: negative for rash and pruritus Heme: negative for easy bruising and gum/nose bleeding MS: Generalized weakness.  Poor mobility due to osteoarthritis of the spine and other joints as well. Neurolo:negative for headaches, dizziness, vertigo, memory  problems  Psych: negative for feelings of anxiety, depression  Endocrine: Diabetes mellitus Allergy/Immunology-Says doxycycline causes nausea and vomiting  Objective:  VITALS:  BP 137/77 (BP Location: Left Arm)   Pulse 82   Temp 98.7 F (37.1 C) (Oral)   Resp (!) 22   Ht 5\' 3"  (1.6 m)   Wt 110 kg   SpO2 98%   BMI 42.97 kg/m  PHYSICAL EXAM:  General: Alert, cooperative, no distress, appears stated age.  Sitting in chair.  Fine tremors on his face, morbid obesity Head: Normocephalic,  without obvious abnormality, atraumatic. Eyes: Conjunctivae clear, anicteric sclerae. Pupils are equal ENT Nares normal. No drainage or sinus tenderness. Lips, mucosa, and tongue normal. No Thrush Neck: Supple, symmetrical, no adenopathy, thyroid: non tender no carotid bruit and no JVD. Back: No CVA tenderness. Lungs: Bilateral air entry Heart: S1-S2 Abdomen: Right upper quadrant drain present.  Surgical dressing present  bowel sounds normal. No masses Extremities: Minimal ankle edema Skin: No rashes or lesions. Or bruising Lymph: Cervical, supraclavicular normal. Neurologic: Grossly non-focal Pertinent Labs Lab Results CBC    Component Value Date/Time   WBC 9.2 05/08/2019 0526   RBC 3.89 (L) 05/08/2019 0526   HGB 11.8 (L) 05/08/2019 0526   HGB 12.2 (L) 12/16/2013 0348   HCT 35.5 (L) 05/08/2019 0526   HCT 36.4 (L) 12/16/2013 0348   PLT 139 (L) 05/08/2019 0526   PLT 194 12/16/2013 0348   MCV 91.3 05/08/2019 0526   MCV 92 12/16/2013 0348   MCH 30.3 05/08/2019 0526   MCHC 33.2 05/08/2019 0526   RDW 14.4 05/08/2019 0526   RDW 14.5 12/16/2013 0348   LYMPHSABS 1.7 12/16/2013 0348   MONOABS 0.9 12/16/2013 0348   EOSABS 0.3 12/16/2013 0348   BASOSABS 0.1 12/16/2013 0348    CMP Latest Ref Rng & Units 05/08/2019 05/07/2019 05/05/2019  Glucose 70 - 99 mg/dL 181(H) 179(H) 160(H)  BUN 8 - 23 mg/dL 21 25(H) 19  Creatinine 0.61 - 1.24 mg/dL 1.43(H) 1.57(H) 1.17  Sodium 135 - 145 mmol/L 137 135 137  Potassium 3.5 - 5.1 mmol/L 4.1 4.1 4.6  Chloride 98 - 111 mmol/L 108 103 103  CO2 22 - 32 mmol/L 22 23 25   Calcium 8.9 - 10.3 mg/dL 7.5(L) 7.9(L) 9.0  Total Protein 6.5 - 8.1 g/dL 6.3(L) 6.5 7.9  Total Bilirubin 0.3 - 1.2 mg/dL 0.6 1.0 0.5  Alkaline Phos 38 - 126 U/L 63 67 99  AST 15 - 41 U/L 72(H) 25 23  ALT 0 - 44 U/L 65(H) 26 24      Microbiology: Recent Results (from the past 240 hour(s))  SARS Coronavirus 2 (CEPHEID - Performed in [place] hospital lab), Hosp Order      Status: None   Collection Time: 05/06/19  4:58 AM   Specimen: Nasopharyngeal Swab  Result Value Ref Range Status   SARS Coronavirus 2 NEGATIVE NEGATIVE Final    Comment: (NOTE) If result is NEGATIVE SARS-CoV-2 target nucleic acids are NOT DETECTED. The SARS-CoV-2 RNA is generally detectable in upper and lower  respiratory specimens during the acute phase of infection. The lowest  concentration of SARS-CoV-2 viral copies this assay can detect is 250  copies / mL. A negative result does not preclude SARS-CoV-2 infection  and should not be used as the sole basis for treatment or other  patient management decisions.  A negative result may occur with  improper specimen collection / handling, submission of specimen other  than nasopharyngeal swab, presence of  viral mutation(s) within the  areas targeted by this assay, and inadequate number of viral copies  (<250 copies / mL). A negative result must be combined with clinical  observations, patient history, and epidemiological information. If result is POSITIVE SARS-CoV-2 target nucleic acids are DETECTED. The SARS-CoV-2 RNA is generally detectable in upper and lower  respiratory specimens dur ing the acute phase of infection.  Positive  results are indicative of active infection with SARS-CoV-2.  Clinical  correlation with patient history and other diagnostic information is  necessary to determine patient infection status.  Positive results do  not rule out bacterial infection or co-infection with other viruses. If result is PRESUMPTIVE POSTIVE SARS-CoV-2 nucleic acids MAY BE PRESENT.   A presumptive positive result was obtained on the submitted specimen  and confirmed on repeat testing.  While 2019 novel coronavirus  (SARS-CoV-2) nucleic acids may be present in the submitted sample  additional confirmatory testing may be necessary for epidemiological  and / or clinical management purposes  to differentiate between  SARS-CoV-2 and other  Sarbecovirus currently known to infect humans.  If clinically indicated additional testing with an alternate test  methodology [phone]) is advised. The SARS-CoV-2 RNA is generally  detectable in upper and lower respiratory sp ecimens during the acute  phase of infection. The expected result is Negative. Fact Sheet for Patients:  StrictlyIdeas.no Fact Sheet for Healthcare Providers: BankingDealers.co.za This test is not yet approved or cleared by the [place] FDA and has been authorized for detection and/or diagnosis of SARS-CoV-2 by FDA under an Emergency Use Authorization (EUA).  This EUA will remain in effect (meaning this test can be used) for the duration of the COVID-19 declaration under Section 564(b)(1) of the Act, 21 U.S.C. section 360bbb-3(b)(1), unless the authorization is terminated or revoked sooner. Performed at [facility], [address], [place], [place] [address]   SARS Coronavirus 2 (CEPHEID- Performed in [facility] hospital lab), Hosp Order     Status: None   Collection Time: 05/06/19 11:35 PM   Specimen: Nasopharyngeal Swab  Result Value Ref Range Status   SARS Coronavirus 2 NEGATIVE NEGATIVE Final    Comment: (NOTE) If result is NEGATIVE SARS-CoV-2 target nucleic acids are NOT DETECTED. The SARS-CoV-2 RNA is generally detectable in upper and lower  respiratory specimens during the acute phase of infection. The lowest  concentration of SARS-CoV-2 viral copies this assay can detect is 250  copies / mL. A negative result does not preclude SARS-CoV-2 infection  and should not be used as the sole basis for treatment or other  patient management decisions.  A negative result may occur with  improper specimen collection / handling, submission of specimen other  than nasopharyngeal swab, presence of viral mutation(s) within the  areas targeted by this assay, and inadequate number of viral copies  (<250  copies / mL). A negative result must be combined with clinical  observations, patient history, and epidemiological information. If result is POSITIVE SARS-CoV-2 target nucleic acids are DETECTED. The SARS-CoV-2 RNA is generally detectable in upper and lower  respiratory specimens dur ing the acute phase of infection.  Positive  results are indicative of active infection with SARS-CoV-2.  Clinical  correlation with patient history and other diagnostic information is  necessary to determine patient infection status.  Positive results do  not rule out bacterial infection or co-infection with other viruses. If result is PRESUMPTIVE POSTIVE SARS-CoV-2 nucleic acids MAY BE PRESENT.   A presumptive positive result was obtained  on the submitted specimen  and confirmed on repeat testing.  While 2019 novel coronavirus  (SARS-CoV-2) nucleic acids may be present in the submitted sample  additional confirmatory testing may be necessary for epidemiological  and / or clinical management purposes  to differentiate between  SARS-CoV-2 and other Sarbecovirus currently known to infect humans.  If clinically indicated additional testing with an alternate test  methodology [phone]) is advised. The SARS-CoV-2 RNA is generally  detectable in upper and lower respiratory sp ecimens during the acute  phase of infection. The expected result is Negative. Fact Sheet for Patients:  StrictlyIdeas.no Fact Sheet for Healthcare Providers: BankingDealers.co.za This test is not yet approved or cleared by the [place] FDA and has been authorized for detection and/or diagnosis of SARS-CoV-2 by FDA under an Emergency Use Authorization (EUA).  This EUA will remain in effect (meaning this test can be used) for the duration of the COVID-19 declaration under Section 564(b)(1) of the Act, 21 U.S.C. section 360bbb-3(b)(1), unless the authorization is terminated or revoked  sooner. Performed at [facility], [address] [place], [place] [address]   Culture, blood (Routine X 2) w Reflex to ID Panel     Status: Abnormal (Preliminary result)   Collection Time: 05/07/19  1:03 AM   Specimen: BLOOD  Result Value Ref Range Status   Specimen Description   Final    BLOOD LEFT ANTECUBITAL Performed at [facility], [address] [place], [place] [address]    Special Requests   Final    BOTTLES DRAWN AEROBIC AND ANAEROBIC Blood Culture adequate volume Performed at [facility], [address], [place], [place] [address]    Culture  Setup Time   Final    GRAM NEGATIVE RODS IN BOTH AEROBIC AND ANAEROBIC BOTTLES CRITICAL RESULT CALLED TO, READ BACK BY AND VERIFIED WITH: LISA KLUTTZ AT 1243 05/07/2019 SDR Performed at [facility], [place] [address] [place], [place] [address]    Culture KLEBSIELLA PNEUMONIAE (A)  Final   Report Status PENDING  Incomplete  Culture, blood (Routine X 2) w Reflex to ID Panel     Status: Abnormal (Preliminary result)   Collection Time: 05/07/19  1:03 AM   Specimen: BLOOD LEFT HAND  Result Value Ref Range Status   Specimen Description   Final    BLOOD LEFT HAND Performed at [facility], [place] [address], [place], [place] [address]    Special Requests   Final    BOTTLES DRAWN AEROBIC AND ANAEROBIC Blood Culture adequate volume Performed at [facility], [address] [place],  [address]    Culture  Setup Time   Final    GRAM NEGATIVE RODS AEROBIC BOTTLE ONLY CRITICAL VALUE NOTED.  VALUE IS CONSISTENT WITH PREVIOUSLY REPORTED AND CALLED VALUE. Performed at [facility], [place] [place],  [address]    Culture KLEBSIELLA PNEUMONIAE (A)  Final   Report Status PENDING  Incomplete  Blood Culture ID Panel (Reflexed)     Status: Abnormal   Collection Time: 05/07/19  1:03 AM  Result Value Ref Range Status   Enterococcus species NOT DETECTED NOT  DETECTED Final   Listeria monocytogenes NOT DETECTED NOT DETECTED Final   Staphylococcus species NOT DETECTED NOT DETECTED Final   Staphylococcus aureus (BCID) NOT DETECTED NOT DETECTED Final   Streptococcus species NOT DETECTED NOT DETECTED Final   Streptococcus agalactiae NOT DETECTED NOT DETECTED Final   Streptococcus pneumoniae NOT DETECTED NOT DETECTED  Final   Streptococcus pyogenes NOT DETECTED NOT DETECTED Final   Acinetobacter baumannii NOT DETECTED NOT DETECTED Final   Enterobacteriaceae species DETECTED (A) NOT DETECTED Final    Comment: Enterobacteriaceae represent a large family of gram-negative bacteria, not a single organism. CRITICAL RESULT CALLED TO, READ BACK BY AND VERIFIED WITH:  LISA KLUTTZ AT 7943 05/07/2019 SDR    Enterobacter cloacae complex NOT DETECTED NOT DETECTED Final   Escherichia coli NOT DETECTED NOT DETECTED Final   Klebsiella oxytoca NOT DETECTED NOT DETECTED Final   Klebsiella pneumoniae DETECTED (A) NOT DETECTED Final    Comment: CRITICAL RESULT CALLED TO, READ BACK BY AND VERIFIED WITH:  LISA KLUTTZ AT 1243 05/07/2019 SDR    Proteus species NOT DETECTED NOT DETECTED Final   Serratia marcescens NOT DETECTED NOT DETECTED Final   Carbapenem resistance NOT DETECTED NOT DETECTED Final   Haemophilus influenzae NOT DETECTED NOT DETECTED Final   Neisseria meningitidis NOT DETECTED NOT DETECTED Final   Pseudomonas aeruginosa NOT DETECTED NOT DETECTED Final   Candida albicans NOT DETECTED NOT DETECTED Final   Candida glabrata NOT DETECTED NOT DETECTED Final   Candida krusei NOT DETECTED NOT DETECTED Final   Candida parapsilosis NOT DETECTED NOT DETECTED Final   Candida tropicalis NOT DETECTED NOT DETECTED Final    Comment: Performed at [facility], [address] [place], [place] [address]    IMAGING RESULTS: CT scan reviewed. I have personally reviewed the films ? Impression/Recommendation ? ?73 year old male presenting with upper  abdominal pain  Gangrenous cholecystitis status post laparoscopic cholecystectomy.  Now has right upper quadrant drain.  Klebsiella bacteremia secondary to the above.  Currently on Zosyn.  I will continue Zosyn as it covers enterococcus as well as anaerobes.  Await susceptibility of Klebsiella to decide on management on discharge. . ? _BPH on finasteride and tamsulosin.  __________________________________________________ Discussed with patient, Note:  This document was prepared using Dragon voice recognition software and may include unintentional dictation errors.

## 2019-05-08 NOTE — Progress Notes (Signed)
Family Meeting Note  Advance Directive:yes  Today a meeting took place with the Patient.   The following clinical team members were present during this meeting:MD  The following were discussed:Patient's diagnosis: bacteremia, Cholecystitis,  , Patient's progosis: Unable to determine and Goals for treatment: DNR  Additional follow-up to be provided: PMD  Time spent during discussion:20 minutes  Vaughan Basta, MD

## 2019-05-08 NOTE — Progress Notes (Signed)
[facility] at [place] NAME: Zachary Chase    MR#:  [id number]  DATE OF BIRTH:  1945/11/13  SUBJECTIVE:  CHIEF COMPLAINT:   Chief Complaint  Patient presents with  . Abdominal Pain   Came with abdominal and epigastric pain.  Still have RUQ  pain.  Troponins are negative.  Status post cholecystectomy.  REVIEW OF SYSTEMS:  CONSTITUTIONAL: No fever, fatigue or weakness.  EYES: No blurred or double vision.  EARS, NOSE, AND THROAT: No tinnitus or ear pain.  RESPIRATORY: No cough, shortness of breath, wheezing or hemoptysis.  CARDIOVASCULAR: No chest pain, orthopnea, edema.  GASTROINTESTINAL: No nausea, vomiting, diarrhea , have abdominal pain.  GENITOURINARY: No dysuria, hematuria.  ENDOCRINE: No polyuria, nocturia,  HEMATOLOGY: No anemia, easy bruising or bleeding SKIN: No rash or lesion. MUSCULOSKELETAL: No joint pain or arthritis.   NEUROLOGIC: No tingling, numbness, weakness.  PSYCHIATRY: No anxiety or depression.   ROS  DRUG ALLERGIES:   Allergies  Allergen Reactions  . Erythromycin Nausea And Vomiting  . Sulfa Antibiotics Other (See Comments)    unknown  . Tetracyclines & Related Nausea And Vomiting    VITALS:  Blood pressure 137/77, pulse 82, temperature 98.7 F (37.1 C), temperature source Oral, resp. rate (!) 22, height 5\' 3"  (1.6 m), weight 110 kg, SpO2 98 %.  PHYSICAL EXAMINATION:  GENERAL:  73 y.o.-year-old patient lying in the bed with no acute distress.  EYES: Pupils equal, round, reactive to light and accommodation. No scleral icterus. Extraocular muscles intact.  HEENT: Head atraumatic, normocephalic. Oropharynx and nasopharynx clear.  NECK:  Supple, no jugular venous distention. No thyroid enlargement, no tenderness.  LUNGS: Normal breath sounds bilaterally, no wheezing, rales,rhonchi or crepitation. No use of accessory muscles of respiration.  CARDIOVASCULAR: S1, S2 normal. No murmurs, rubs, or gallops.   ABDOMEN: Soft, tender, nondistended. Bowel sounds present. No organomegaly or mass. S/p laproscopy. EXTREMITIES: No pedal edema, cyanosis, or clubbing.  NEUROLOGIC: Cranial nerves II through XII are intact. Muscle strength 5/5 in all extremities. Sensation intact. Gait not checked.  PSYCHIATRIC: The patient is alert and oriented x 3.  SKIN: No obvious rash, lesion, or ulcer.   Physical Exam LABORATORY PANEL:   CBC Recent Labs  Lab 05/08/19 0526  WBC 9.2  HGB 11.8*  HCT 35.5*  PLT 139*   ------------------------------------------------------------------------------------------------------------------  Chemistries  Recent Labs  Lab 05/08/19 0526  NA 137  K 4.1  CL 108  CO2 22  GLUCOSE 181*  BUN 21  CREATININE 1.43*  CALCIUM 7.5*  AST 72*  ALT 65*  ALKPHOS 63  BILITOT 0.6   ------------------------------------------------------------------------------------------------------------------  Cardiac Enzymes No results for input(s): TROPONINI in the last 168 hours. ------------------------------------------------------------------------------------------------------------------  RADIOLOGY:  Ct Abdomen Pelvis W Contrast  Result Date: 05/07/2019 CLINICAL DATA:  Epigastric abdominal pain for 2-3 days.  Sepsis. EXAM: CT ABDOMEN AND PELVIS WITH CONTRAST TECHNIQUE: Multidetector CT imaging of the abdomen and pelvis was performed using the standard protocol following bolus administration of intravenous contrast. CONTRAST:  75 mL OMNIPAQUE IOHEXOL 300 MG/ML  SOLN COMPARISON:  CT abdomen and pelvis 12/06/2017. CT chest, abdomen and pelvis 05/06/2019. FINDINGS: Lower chest: No pleural or pericardial effusion. Mild bronchiectasis in the right lower lobe is unchanged. There is some dependent atelectasis. Lung bases otherwise clear. Hepatobiliary: There appears to be sludge or a few small stones in the gallbladder. Mild stranding is seen about the inferior aspect of the gallbladder. Liver  and biliary tree appear normal. Pancreas:  Unremarkable. No pancreatic ductal dilatation or surrounding inflammatory changes. Spleen: Normal in size without focal abnormality. Adrenals/Urinary Tract: Adrenal glands are unremarkable. Kidneys are normal, without renal calculi, focal lesion, or hydronephrosis. There is some contrast material in the urinary bladder from the patient's CT scan yesterday bladder is otherwise unremarkable. Stomach/Bowel: Stomach is within normal limits. Appendix appears normal. Walls of the first portion of the duodenum are thickened but it is incompletely distended. It is partially distended on yesterday's examination and wall thickening is less apparent on that study. Small bowel is otherwise unremarkable. Scattered diverticulosis without diverticulitis noted. Vascular/Lymphatic: Aortic atherosclerosis. No enlarged abdominal or pelvic lymph nodes. Reproductive: Prostatomegaly noted. Other: Small fat containing umbilical and bilateral inguinal hernias are seen. Musculoskeletal: No fracture or focal lesion. Degenerative disease about the hips and lumbar spine noted. IMPRESSION: Stones or sludge within the gallbladder. There is stranding about the gallbladder and first portion of the duodenum which could be due to cholecystitis or duodenitis. Walls of the first portion the duodenum are thickened but the it is under distended on today's study. Mild thickening is seen on yesterday's exam. Right upper quadrant ultrasound could be used for further evaluation of the gallbladder. Atherosclerosis. Diverticulosis without diverticulitis. Small fat containing umbilical and bilateral inguinal hernias. Electronically Signed   By: Inge Rise M.D.   On: 05/07/2019 14:04   US Abdomen Limited Ruq  Result Date: 05/07/2019 CLINICAL DATA:  New pericholecystic inflammatory change on recent CT examination. EXAM: ULTRASOUND ABDOMEN LIMITED RIGHT UPPER QUADRANT COMPARISON:  None. FINDINGS: Gallbladder:  Gallbladder is well distended with evidence of gallstones. Negative sonographic Murphy's sign is noted. No significant pericholecystic fluid or wall thickening is noted. Common bile duct: Diameter: 4.4 mm. Liver: Increased in echogenicity consistent with fatty infiltration. No focal mass is noted. Portal vein is patent on color Doppler imaging with normal direction of blood flow towards the liver. IMPRESSION: Cholelithiasis is noted with a negative sonographic Murphy sign. No pericholecystic fluid is noted. Fatty liver. Electronically Signed   By: Inez Catalina M.D.   On: 05/07/2019 16:30    ASSESSMENT AND PLAN:   Active Problems:   Chest pain   Abdominal pain  1.  Chest pain: Atypical in duration and presentation.  3 troponins are negative, seen by cardiologist and suggested no further cardiac work-up and likely noncardiac origin of the pain.  Suggested to get GI consult I spoke to GI who is suggesting previous finding of esophagitis and start on PPI twice daily and sucralfate and if patient does not improve then may need EGD.  Patient's 3 troponins are negative-and the chest pain is atypical.   2.  Sepsis,  acute cholecystitis, gram-negative bacteremia Patient had fever and blood cultures reported gram-negative bacteremia.  CT scan abdomen reports acute cholecystitis. S/p cholecystectomy.  2.  Hypertension: Intermittent control; patient was on lisinopril and hydrochlorothiazide but now has slight worsening in the renal function and also required contrast for the CT scan so I would hold both of them and start on small dose metoprolol for better blood pressure control.  Also worried that with gram-negative bacteremia and fever his blood pressure might drop so I would not even for very tight blood pressure control currently.  2.  Acute renal failure This could be due to sepsis, infection and cholecystitis, IV fluids and continue to monitor.  Avoid nephrotoxic medications and diuretics.  3.   Hyperlipidemia: Continue statin therapy 4.  BPH: Continue tamsulosin 5.  Asthma: Continue inhaled corticosteroid.  Albuterol as  needed. 6.  DVT prophylaxis: Lovenox 7.  GI prophylaxis: PPI BID as above.    All the records are reviewed and case discussed with Care Management/Social Workerr. Management plans discussed with the patient, family and they are in agreement.  CODE STATUS: Full.  TOTAL TIME TAKING CARE OF THIS PATIENT: 45 minutes.    POSSIBLE D/C IN 1-2 DAYS, DEPENDING ON CLINICAL CONDITION.   Vaughan Basta M.D on 05/08/2019   Between 7am to 6pm - Pager - [phone]  After 6pm go to [email or web address] - password [email or web address] [facility]  Office  [phone]  CC: Primary care physician; Juluis Pitch, MD  Note: This dictation was prepared with Dragon dictation along with smaller phrase technology. Any transcriptional errors that result from this process are unintentional.

## 2019-05-08 NOTE — Anesthesia Postprocedure Evaluation (Signed)
Anesthesia Post Note  Patient: Zachary Chase  Procedure(s) Performed: LAPAROSCOPIC CHOLECYSTECTOMY (N/A Abdomen)  Patient location during evaluation: PACU Anesthesia Type: General Level of consciousness: awake and alert Pain management: pain level controlled Vital Signs Assessment: post-procedure vital signs reviewed and stable Respiratory status: spontaneous breathing, nonlabored ventilation and respiratory function stable Cardiovascular status: blood pressure returned to baseline and stable Postop Assessment: no apparent nausea or vomiting Anesthetic complications: no     Last Vitals:  Vitals:   05/07/19 2229 05/07/19 2328  BP: (!) 142/71 (!) 147/81  Pulse: 88 93  Resp: (!) 24 20  Temp: 36.8 C   SpO2: 98% 96%    Last Pain:  Vitals:   05/07/19 2229  TempSrc: Oral  PainSc:                  Durenda Hurt

## 2019-05-08 NOTE — Progress Notes (Signed)
[facility] Hospital Day(s): 1.   Post op day(s): 1 Day Post-Op.   Interval History: Patient seen and examined, no acute events or new complaints overnight. Patient reports that he is doing better this morning. He reports abdominal soreness worse at mini-laparotomy incision. No reports of fever, chills, nausea, or emesis. He has tolerated clear liquids without issue. JP with 70 ccs out. No further complaints of issues.    Vital signs in last 24 hours: [min-max] current  Temp:  [97.5 F (36.4 C)-100.2 F (37.9 C)] 98.7 F (37.1 C) (07/13 0749) Pulse Rate:  [80-104] 82 (07/13 0749) Resp:  [16-25] 22 (07/13 0749) BP: (111-147)/(65-84) 137/77 (07/13 0749) SpO2:  [89 %-98 %] 98 % (07/13 0749) Weight:  [110 kg-110.2 kg] 110 kg (07/13 0446)     Height: 5\' 3"  (160 cm) Weight: 110 kg BMI (Calculated): 42.99   Intake/Output last 2 shifts:  07/12 0701 - 07/13 0700 In: 2210 [I.V.:2160; IV Piggyback:50] Out: 1970 [Urine:1600; Drains:70; Blood:300]   Physical Exam:  Constitutional: alert, cooperative and no distress  Respiratory: breathing non-labored at rest. On [place]  Gastrointestinal: soft, incisional tenderness worse at superior portion of mini laparotomy, and non-distended. No rebound/guarding. JP in RUQ with serosanguinous output.  Integumentary: Laparotomy and laparoscopic incisions are CDI with honeycomb dressing, no erythema   Labs:  CBC Latest Ref Rng & Units 05/08/2019 05/07/2019 05/05/2019  WBC 4.0 - 10.5 K/uL 9.2 10.7(H) 9.0  Hemoglobin 13.0 - 17.0 g/dL 11.8(L) 13.0 13.7  Hematocrit 39.0 - 52.0 % 35.5(L) 39.3 41.0  Platelets 150 - 400 K/uL 139(L) 136(L) 213   CMP Latest Ref Rng & Units 05/08/2019 05/07/2019 05/05/2019  Glucose 70 - 99 mg/dL 181(H) 179(H) 160(H)  BUN 8 - 23 mg/dL 21 25(H) 19  Creatinine 0.61 - 1.24 mg/dL 1.43(H) 1.57(H) 1.17  Sodium 135 - 145 mmol/L 137 135 137  Potassium 3.5 - 5.1 mmol/L 4.1 4.1 4.6  Chloride 98 - 111  mmol/L 108 103 103  CO2 22 - 32 mmol/L 22 23 25   Calcium 8.9 - 10.3 mg/dL 7.5(L) 7.9(L) 9.0  Total Protein 6.5 - 8.1 g/dL 6.3(L) 6.5 7.9  Total Bilirubin 0.3 - 1.2 mg/dL 0.6 1.0 0.5  Alkaline Phos 38 - 126 U/L 63 67 99  AST 15 - 41 U/L 72(H) 25 23  ALT 0 - 44 U/L 65(H) 26 24     Imaging studies: No new pertinent imaging studies   Assessment/Plan:  73 y.o. male with expected post-surgical soreness at incisions but otherwise doing well 1 Day Post-Op s/p hand-assisted laparoscopic cholecystectomy for gangrenous cholecystitis   - Advance to full liquid diet; advance as tolerates   - Continue IV ABx (Zosyn) + IVF  - pain control prn; antiemetics prn  - monitor abdominal examination; on-going bowel function   - Monitor JP output   - mobilization encouraged  - medical management of comorbidities  - DVT prophylaxis   All of the above findings and recommendations were discussed with the patient, and the medical team, and all of patient's questions were answered to his expressed satisfaction.  -- Edison Simon, PA-C [facility] 05/08/2019, 10:44 AM [phone] M-F: 7am - 4pm \

## 2019-05-09 LAB — HEPATIC FUNCTION PANEL
ALT: 66 U/L — ABNORMAL HIGH (ref 0–44)
AST: 54 U/L — ABNORMAL HIGH (ref 15–41)
Albumin: 2.8 g/dL — ABNORMAL LOW (ref 3.5–5.0)
Alkaline Phosphatase: 69 U/L (ref 38–126)
Bilirubin, Direct: 0.1 mg/dL (ref 0.0–0.2)
Indirect Bilirubin: 0.4 mg/dL (ref 0.3–0.9)
Total Bilirubin: 0.5 mg/dL (ref 0.3–1.2)
Total Protein: 5.8 g/dL — ABNORMAL LOW (ref 6.5–8.1)

## 2019-05-09 LAB — CULTURE, BLOOD (ROUTINE X 2)
Special Requests: ADEQUATE
Special Requests: ADEQUATE

## 2019-05-09 LAB — SURGICAL PATHOLOGY

## 2019-05-09 MED ORDER — SODIUM CHLORIDE 0.9 % IV SOLN
INTRAVENOUS | Status: DC | PRN
  Administered 2019-05-09: 500 mL via INTRAVENOUS

## 2019-05-09 MED ORDER — SODIUM CHLORIDE 0.9% FLUSH
3.0000 mL | Freq: Two times a day (BID) | INTRAVENOUS | Status: DC
  Administered 2019-05-09 (×2): 3 mL via INTRAVENOUS

## 2019-05-09 MED ORDER — SODIUM CHLORIDE 0.9% FLUSH: 3.0000 mL | INTRAVENOUS | Status: DC | PRN

## 2019-05-09 NOTE — Progress Notes (Signed)
PT Cancellation Note  Patient Details Name: Abimael Zeiter MRN: [id number] DOB: 04/01/1946   Cancelled Treatment:    Reason Eval/Treat Not Completed: Other (comment)(Pt requesting PT to come back after breakfast. PT will follow up as able.)   Lieutenant Diego PT, DPT 8:50 AM,05/09/19 [phone]

## 2019-05-09 NOTE — Progress Notes (Signed)
Demonstrated and explained how to empty the JP drain.  He should do a return demonstration tonight and tomorrow.

## 2019-05-09 NOTE — Progress Notes (Signed)
[facility] [place] NAME: Zachary Chase    MR#:  [id number]  DATE OF BIRTH:  Dec 17, 1945  SUBJECTIVE:  CHIEF COMPLAINT:   Chief Complaint  Patient presents with  . Abdominal Pain   Came with abdominal and epigastric pain.  Still have RUQ  pain.  Troponins are negative.  Status post cholecystectomy.  REVIEW OF SYSTEMS:  CONSTITUTIONAL: No fever, fatigue or weakness.  EYES: No blurred or double vision.  EARS, NOSE, AND THROAT: No tinnitus or ear pain.  RESPIRATORY: No cough, shortness of breath, wheezing or hemoptysis.  CARDIOVASCULAR: No chest pain, orthopnea, edema.  GASTROINTESTINAL: No nausea, vomiting, diarrhea , have abdominal pain.  GENITOURINARY: No dysuria, hematuria.  ENDOCRINE: No polyuria, nocturia,  HEMATOLOGY: No anemia, easy bruising or bleeding SKIN: No rash or lesion. MUSCULOSKELETAL: No joint pain or arthritis.   NEUROLOGIC: No tingling, numbness, weakness.  PSYCHIATRY: No anxiety or depression.   ROS  DRUG ALLERGIES:   Allergies  Allergen Reactions  . Erythromycin Nausea And Vomiting  . Sulfa Antibiotics Other (See Comments)    unknown  . Tetracyclines & Related Nausea And Vomiting    VITALS:  Blood pressure 130/64, pulse (!) 59, temperature 97.7 F (36.5 C), temperature source Oral, resp. rate 18, height 5\' 3"  (1.6 m), weight 110 kg, SpO2 96 %.  PHYSICAL EXAMINATION:  GENERAL:  73 y.o.-year-old patient lying in the bed with no acute distress.  EYES: Pupils equal, round, reactive to light and accommodation. No scleral icterus. Extraocular muscles intact.  HEENT: Head atraumatic, normocephalic. Oropharynx and nasopharynx clear.  NECK:  Supple, no jugular venous distention. No thyroid enlargement, no tenderness.  LUNGS: Normal breath sounds bilaterally, no wheezing, rales,rhonchi or crepitation. No use of accessory muscles of respiration.  CARDIOVASCULAR: S1, S2 normal. No murmurs, rubs, or gallops.   ABDOMEN: Soft, tender, nondistended. Bowel sounds present. No organomegaly or mass. S/p laproscopy. JP drain. EXTREMITIES: No pedal edema, cyanosis, or clubbing.  NEUROLOGIC: Cranial nerves II through XII are intact. Muscle strength 5/5 in all extremities. Sensation intact. Gait not checked.  PSYCHIATRIC: The patient is alert and oriented x 3.  SKIN: No obvious rash, lesion, or ulcer.   Physical Exam LABORATORY PANEL:   CBC Recent Labs  Lab 05/08/19 0526  WBC 9.2  HGB 11.8*  HCT 35.5*  PLT 139*   ------------------------------------------------------------------------------------------------------------------  Chemistries  Recent Labs  Lab 05/08/19 0526 05/09/19 0804  NA 137  --   K 4.1  --   CL 108  --   CO2 22  --   GLUCOSE 181*  --   BUN 21  --   CREATININE 1.43*  --   CALCIUM 7.5*  --   AST 72* 54*  ALT 65* 66*  ALKPHOS 63 69  BILITOT 0.6 0.5   ------------------------------------------------------------------------------------------------------------------  Cardiac Enzymes No results for input(s): TROPONINI in the last 168 hours. ------------------------------------------------------------------------------------------------------------------  RADIOLOGY:  No results found.  ASSESSMENT AND PLAN:   Active Problems:   Chest pain   Abdominal pain  1.  Chest pain: Atypical in duration and presentation.  3 troponins are negative, seen by cardiologist and suggested no further cardiac work-up and likely noncardiac origin of the pain.  Suggested to get GI consult I spoke to GI who is suggesting previous finding of esophagitis and start on PPI twice daily and sucralfate and if patient does not improve then may need EGD.  Patient's 3 troponins are negative-and the chest pain is atypical.  2.  Sepsis,  acute cholecystitis, gram-negative bacteremia Patient had fever and blood cultures reported gram-negative bacteremia.  CT scan abdomen reports acute  cholecystitis. S/p cholecystectomy. Started on full liquid diet. Tolerated, on soft now.  2.  Hypertension: Intermittent control; patient was on lisinopril and hydrochlorothiazide but now has slight worsening in the renal function and also required contrast for the CT scan so I would hold both of them and start on small dose metoprolol for better blood pressure control.  Also worried that with gram-negative bacteremia and fever his blood pressure might drop so I would not even for very tight blood pressure control currently.  2.  Acute renal failure This could be due to sepsis, infection and cholecystitis, IV fluids and continue to monitor.  Avoid nephrotoxic medications and diuretics.  3.  Hyperlipidemia: Continue statin therapy 4.  BPH: Continue tamsulosin 5.  Asthma: Continue inhaled corticosteroid.  Albuterol as needed. 6.  DVT prophylaxis: Lovenox 7.  GI prophylaxis: PPI BID as above.    All the records are reviewed and case discussed with Care Management/Social Workerr. Management plans discussed with the patient, family and they are in agreement.  CODE STATUS: Full.  TOTAL TIME TAKING CARE OF THIS PATIENT: 45 minutes.   POSSIBLE D/C IN 1-2 DAYS, DEPENDING ON CLINICAL CONDITION.   Vaughan Basta M.D on 05/09/2019   Between 7am to 6pm - Pager - [phone]  After 6pm go to [email or web address] - password [email or web address] [facility]  Office  [phone]  CC: Primary care physician; Juluis Pitch, MD  Note: This dictation was prepared with Dragon dictation along with smaller phrase technology. Any transcriptional errors that result from this process are unintentional.

## 2019-05-09 NOTE — Progress Notes (Signed)
JP drain dressing changed.  The old one was falling off.

## 2019-05-09 NOTE — Progress Notes (Addendum)
[facility] [facility] Hospital Day(s): 2.   Post op day(s): 2 Days Post-Op.   Interval History: Patient seen and examined, no acute events or new complaints overnight. Patient reports that he is doing pretty good. He reports some incisional soreness. No fever, chills, nausea, or emesis. He has been able to tolerate a full liquid diet and continues to have bowel function. JP with 700 ccs out, although this seems like it may be entered in error. Fluid still serosanguinous. Pending PT evaluation. No other issues.    Vital signs in last 24 hours: [min-max] current  Temp:  [97.6 F (36.4 C)-98.7 F (37.1 C)] 97.6 F (36.4 C) (07/14 0417) Pulse Rate:  [69-82] 69 (07/14 0417) Resp:  [16-22] 20 (07/14 0417) BP: (115-137)/(59-77) 115/59 (07/14 0417) SpO2:  [98 %] 98 % (07/14 0417)     Height: 5\' 3"  (160 cm) Weight: 110 kg BMI (Calculated): 42.99   Intake/Output last 2 shifts:  07/13 0701 - 07/14 0700 In: 1870 [P.O.:1320; I.V.:400; IV Piggyback:150] Out: 1951 [Urine:1225; Drains:725; Stool:1]   Physical Exam:  Constitutional: alert, cooperative and no distress  Respiratory: breathing non-labored at rest. On [place]  Gastrointestinal: soft, incisional tenderness worse at superior portion of mini laparotomy, and non-distended. No rebound/guarding. JP in RUQ with serosanguinous output.  Integumentary: Laparotomy and laparoscopic incisions are CDI with honeycomb dressing, no erythema   Labs:  CBC Latest Ref Rng & Units 05/08/2019 05/07/2019 05/05/2019  WBC 4.0 - 10.5 K/uL 9.2 10.7(H) 9.0  Hemoglobin 13.0 - 17.0 g/dL 11.8(L) 13.0 13.7  Hematocrit 39.0 - 52.0 % 35.5(L) 39.3 41.0  Platelets 150 - 400 K/uL 139(L) 136(L) 213   CMP Latest Ref Rng & Units 05/08/2019 05/07/2019 05/05/2019  Glucose 70 - 99 mg/dL 181(H) 179(H) 160(H)  BUN 8 - 23 mg/dL 21 25(H) 19  Creatinine 0.61 - 1.24 mg/dL 1.43(H) 1.57(H) 1.17  Sodium 135 - 145 mmol/L 137 135 137  Potassium 3.5 - 5.1 mmol/L  4.1 4.1 4.6  Chloride 98 - 111 mmol/L 108 103 103  CO2 22 - 32 mmol/L 22 23 25   Calcium 8.9 - 10.3 mg/dL 7.5(L) 7.9(L) 9.0  Total Protein 6.5 - 8.1 g/dL 6.3(L) 6.5 7.9  Total Bilirubin 0.3 - 1.2 mg/dL 0.6 1.0 0.5  Alkaline Phos 38 - 126 U/L 63 67 99  AST 15 - 41 U/L 72(H) 25 23  ALT 0 - 44 U/L 65(H) 26 24     Imaging studies: No new pertinent imaging studies   Assessment/Plan:  73 y.o. male overall doing well 2 Days Post-Op s/p hand-assisted laparoscopic cholecystectomy for gangrenous cholecystitis   - Advance to soft diet; wean IVF             - Continue IV ABx (Zosyn); will need 10 days of Augmentin at discharge; pending sensitivities of BCx; appreciate ID help             - pain control prn; antiemetics prn             - monitor abdominal examination; on-going bowel function              - Monitor JP output; will go home with this   - Okay to remove honeycombs prior to discharge             - mobilization encouraged; pending PT evaluation             - medical management of comorbidities             -  DVT prophylaxis    - Discharge planning: From surgical standpoint, patient is okay for discharge (likely tomorrow) pending PT evaluation or further medicine work up. He will go home with JP drain and need teaching. Follow up in 1 week with [facility]. Discharge with 10 days ABx and pain medications.   All of the above findings and recommendations were discussed with the patient, patient's family (via telephone), and the medical team, and all of patient's and family's questions were answered to their expressed satisfaction.  -- Edison Simon, PA-C [facility] 05/09/2019, 7:29 AM [phone] M-F: 7am - 4pm

## 2019-05-09 NOTE — Evaluation (Signed)
Physical Therapy Evaluation Patient Details Name: Zachary Chase MRN: [id number] DOB: [birth year] Today's Date: 05/09/2019   History of Present Illness  Pt presented to ED for abdominal pain 05/05/2019. s/p hand-assisted laparoscopic cholecystectomy for gangrenous cholecystitis 05/07/2019.  Surgical findings were a gangrenous cholecystitis with severe inflammatory response around the cystic duct and infundibular junction.  Blood culture that was sent on 05/07/2019 morning showed Klebsiella pneumoniae. PMH of HTN, LE edema, head tremor    Clinical Impression  Patient alert, up in recliner at start of session, provided clear PLOF information; lives with his wife in a two story home, able to stay on first floor if needed, previously independent with ADLs, no AD in home, [facility] for community ambulation. Denies any falls in the last year.  The patient demonstrated UE and LE strength WFLs, R grip stronger than L grip. Sit <> stand x2 trails this session, mod I with SPC. Pt performed 1 lap of ambulation in room with SPC, did not demonstrate any unsteadiness and no complaints of surgical pain. He then ambulated ~280ft with SPC and CGA/supervision. Pt was able to also sidestep in hallway to avoid obstacles without LOB. spO2 monitored, WFLs throughout. The patient demonstrated and reported return to baseline level of functioning, PT to remain on patient case to encourage mobilization during hospital stay s/p surgery, but no follow-up recommendations anticipated. Patient also encouraged to ambulate at least 3 times a day with staff, RN notified of mobility status.      Follow Up Recommendations No PT follow up    Equipment Recommendations  None recommended by PT;Other (comment)(Pt has SPC)    Recommendations for Other Services       Precautions / Restrictions Precautions Precautions: None Restrictions Weight Bearing Restrictions: No      Mobility  Bed Mobility               General bed  mobility comments: deferred up in recliner  Transfers Overall transfer level: Modified independent Equipment used: Straight cane                Ambulation/Gait Ambulation/Gait assistance: Supervision;Min guard Gait Distance (Feet): 190 Feet Assistive device: Straight cane Gait Pattern/deviations: WFL(Within Functional Limits)   Gait velocity interpretation: 1.31 - 2.62 ft/sec, indicative of limited community Conservation officer, historic buildings Rankin (Stroke Patients Only)       Balance Overall balance assessment: Needs assistance Sitting-balance support: Feet supported Sitting balance-Leahy Scale: Good       Standing balance-Leahy Scale: Fair                               Pertinent Vitals/Pain Pain Assessment: No/denies pain    Home Living Family/patient expects to be discharged to:: Private residence Living Arrangements: Spouse/significant other Available Help at Discharge: Family Type of Home: House Home Access: Stairs to enter Entrance Stairs-Rails: Psychiatric nurse of Steps: 4 Home Layout: Two level;Able to live on main level with bedroom/bathroom Home Equipment: Toilet riser;Cane - single point;Walker - 2 wheels;Grab bars - tub/shower      Prior Function Level of Independence: Independent with assistive device(s)               Hand Dominance   Dominant Hand: Right    Extremity/Trunk Assessment   Upper Extremity Assessment Upper Extremity Assessment: Overall WFL for tasks assessed  Lower Extremity Assessment Lower Extremity Assessment: Overall WFL for tasks assessed    Cervical / Trunk Assessment Cervical / Trunk Assessment: Normal  Communication   Communication: No difficulties  Cognition Arousal/Alertness: Awake/alert Behavior During Therapy: WFL for tasks assessed/performed Overall Cognitive Status: Within Functional Limits for tasks assessed                                         General Comments      Exercises     Assessment/Plan    PT Assessment Patent does not need any further PT services  PT Problem List Decreased balance;Decreased activity tolerance       PT Treatment Interventions DME instruction;Therapeutic exercise;Gait training;Balance training;Stair training;Neuromuscular re-education;Functional mobility training;Therapeutic activities;Patient/family education    PT Goals (Current goals can be found in the Care Plan section)  Acute Rehab PT Goals PT Goal Formulation: With patient    Frequency Min 2X/week   Barriers to discharge        Co-evaluation               AM-PAC PT "6 Clicks" Mobility  Outcome Measure Help needed turning from your back to your side while in a flat bed without using bedrails?: None Help needed moving from lying on your back to sitting on the side of a flat bed without using bedrails?: A Little Help needed moving to and from a bed to a chair (including a wheelchair)?: None Help needed standing up from a chair using your arms (e.g., wheelchair or bedside chair)?: None Help needed to walk in hospital room?: A Little Help needed climbing 3-5 steps with a railing? : None 6 Click Score: 22    End of Session Equipment Utilized During Treatment: Gait belt Activity Tolerance: Patient tolerated treatment well Patient left: in chair;with call bell/phone within reach;with chair alarm set Nurse Communication: Mobility status PT Visit Diagnosis: Other abnormalities of gait and mobility (R26.89)    Time: 5621-3086 PT Time Calculation (min) (ACUTE ONLY): 22 min   Charges:   PT Evaluation $PT Eval Low Complexity: 1 Low PT Treatments $Therapeutic Exercise: 8-22 mins        Lieutenant Diego PT, DPT 10:16 AM,05/09/19 [phone]

## 2019-05-10 DIAGNOSIS — K810 Acute cholecystitis: Secondary | ICD-10-CM

## 2019-05-10 LAB — BASIC METABOLIC PANEL
Anion gap: 7 (ref 5–15)
BUN: 29 mg/dL — ABNORMAL HIGH (ref 8–23)
CO2: 22 mmol/L (ref 22–32)
Calcium: 7.7 mg/dL — ABNORMAL LOW (ref 8.9–10.3)
Chloride: 108 mmol/L (ref 98–111)
Creatinine, Ser: 1.33 mg/dL — ABNORMAL HIGH (ref 0.61–1.24)
GFR calc Af Amer: >60 mL/min (ref >60)
GFR calc non Af Amer: 53 mL/min — ABNORMAL LOW (ref >60)
Glucose, Bld: 130 mg/dL — ABNORMAL HIGH (ref 70–99)
Potassium: 3.6 mmol/L (ref 3.5–5.1)
Sodium: 137 mmol/L (ref 135–145)

## 2019-05-10 LAB — HEPATITIS PANEL, ACUTE
HCV Ab: <0.1 s/co ratio (ref 0.0–0.9)
Hep A IgM: NEGATIVE
Hep B C IgM: NEGATIVE
Hepatitis B Surface Ag: NEGATIVE

## 2019-05-10 MED ORDER — SUCRALFATE 1 G PO TABS: 1.0000 g | ORAL_TABLET | Freq: Three times a day (TID) | ORAL | 0 refills | Status: DC

## 2019-05-10 MED ORDER — METRONIDAZOLE 250 MG PO TABS: 250.0000 mg | ORAL_TABLET | Freq: Three times a day (TID) | ORAL | 0 refills | Status: DC

## 2019-05-10 MED ORDER — OXYCODONE HCL 5 MG PO TABS: 5.0000 mg | ORAL_TABLET | Freq: Four times a day (QID) | ORAL | 0 refills | Status: DC | PRN

## 2019-05-10 MED ORDER — LEVOFLOXACIN 500 MG PO TABS: 500.0000 mg | ORAL_TABLET | Freq: Every day | ORAL | 0 refills | Status: DC

## 2019-05-10 MED ORDER — METOPROLOL TARTRATE 25 MG PO TABS: 12.5000 mg | ORAL_TABLET | Freq: Two times a day (BID) | ORAL | 0 refills | Status: DC

## 2019-05-10 MED ORDER — DOCUSATE SODIUM 100 MG PO CAPS: 100.0000 mg | ORAL_CAPSULE | Freq: Two times a day (BID) | ORAL | 0 refills | Status: DC

## 2019-05-10 MED ORDER — METRONIDAZOLE 250 MG PO TABS: 250.0000 mg | ORAL_TABLET | Freq: Three times a day (TID) | ORAL | 0 refills | Status: AC

## 2019-05-10 MED ORDER — LEVOFLOXACIN 500 MG PO TABS: 500.0000 mg | ORAL_TABLET | Freq: Every day | ORAL | 0 refills | Status: AC

## 2019-05-10 NOTE — Discharge Instructions (Signed)
In addition to included general post-operative instructions for laparoscopic cholecystectomy,  Diet: Resume home heart healthy diet.   Activity: No heavy lifting >20 pounds (children, pets, laundry, garbage) or strenuous activity until follow-up in 2 weeks, but light activity and walking are encouraged. Do not drive or drink alcohol if taking narcotic pain medications or having pain that might distract from driving.  Wound care: You may shower/get incision wet with soapy water and pat dry (do not rub incisions), but no baths or submerging incision underwater until follow-up.   Medications: Resume all home medications. For mild to moderate pain: acetaminophen (Tylenol) or ibuprofen/naproxen (if no kidney disease). Combining Tylenol with alcohol can substantially increase your risk of causing liver disease. Narcotic pain medications, if prescribed, can be used for severe pain, though may cause nausea, constipation, and drowsiness. Do not combine Tylenol and Percocet (or similar) within a 6 hour period as Percocet (and similar) contain(s) Tylenol. If you do not need the narcotic pain medication, you do not need to fill the prescription.  Call office [phone] / [phone]) at any time if any questions, worsening pain, fevers/chills, bleeding, drainage from incision site, or other concerns.

## 2019-05-10 NOTE — Progress Notes (Addendum)
ID Pt doing well\ No pain abdomen Eating Rt upper quadrant drain out Patient Vitals for the past 24 hrs:  BP Temp Temp src Pulse Resp SpO2 Weight  05/10/19 0754 (!) 144/77 97.9 F (36.6 C) Oral 71 19 97 % -  05/10/19 0324 138/67 97.8 F (36.6 C) Oral 69 20 98 % 116.2 kg  05/09/19 1946 133/79 98.4 F (36.9 C) Oral 70 20 98 % -  05/09/19 1742 (!) 141/79 97.7 F (36.5 C) Oral 77 20 98 % -   Abdomen soft- rt upper quadrant drain site okay- drain out BS+  CBC Latest Ref Rng & Units 05/08/2019 05/07/2019 05/05/2019  WBC 4.0 - 10.5 K/uL 9.2 10.7(H) 9.0  Hemoglobin 13.0 - 17.0 g/dL 11.8(L) 13.0 13.7  Hematocrit 39.0 - 52.0 % 35.5(L) 39.3 41.0  Platelets 150 - 400 K/uL 139(L) 136(L) 213    CMP Latest Ref Rng & Units 05/10/2019 05/09/2019 05/08/2019  Glucose 70 - 99 mg/dL 130(H) - 181(H)  BUN 8 - 23 mg/dL 29(H) - 21  Creatinine 0.61 - 1.24 mg/dL 1.33(H) - 1.43(H)  Sodium 135 - 145 mmol/L 137 - 137  Potassium 3.5 - 5.1 mmol/L 3.6 - 4.1  Chloride 98 - 111 mmol/L 108 - 108  CO2 22 - 32 mmol/L 22 - 22  Calcium 8.9 - 10.3 mg/dL 7.7(L) - 7.5(L)  Total Protein 6.5 - 8.1 g/dL - 5.8(L) 6.3(L)  Total Bilirubin 0.3 - 1.2 mg/dL - 0.5 0.6  Alkaline Phos 38 - 126 U/L - 69 63  AST 15 - 41 U/L - 54(H) 72(H)  ALT 0 - 44 U/L - 66(H) 65(H)    Impression /recommendation  Gangrenous cholecystitis s/P lap cholecystectomy  Klebsiella pneumonia bacteremia from the above  On zosyn day 4- will need only 7 more days of antibiotic- on discharge levaquin 750mg  + flagyl 500mg  PO TID until 05/17/19  Discussed the management with the patient- explained the side effects- Discussed with Dr.[email or web address]

## 2019-05-10 NOTE — Discharge Summary (Addendum)
[facility] NAME: Zachary Chase    MR#:  [id number]  DATE OF BIRTH:  February 19, 1946  DATE OF ADMISSION:  05/06/2019 ADMITTING PHYSICIAN: Harrie Foreman, MD  DATE OF DISCHARGE: 05/10/2019   PRIMARY CARE PHYSICIAN: Juluis Pitch, MD    ADMISSION DIAGNOSIS:  Sinus tachycardia [R00.0] Elevated troponin level [R79.89] Upper abdominal pain [R10.10]  DISCHARGE DIAGNOSIS:  Active Problems:   Chest pain   Abdominal pain   SECONDARY DIAGNOSIS:   Past Medical History:  Diagnosis Date  . Anemia   . Arthritis   . Asthma   . Back pain   . BPH (benign prostatic hyperplasia)   . Dermatophytosis of nail   . Enthesopathy of hip region   . Gastric ulcer   . H/O multiple allergies   . History of colonic polyps   . Hx of onychia and paronychia   . Hypersomnia   . Hypertension   . Lower extremity edema   . Pneumonia   . Prostatic hypertrophy   . Pure hypercholesterolemia   . Sleep apnea   . Testicular hypofunction   . Tremor    of the head  . Wheezing     HOSPITAL COURSE:   1. Chest pain: Atypical in duration and presentation.  3 troponins are negative, seen by cardiologist and suggested no further cardiac work-up and likely noncardiac origin of the pain.  Suggested to get GI consult I spoke to GI who is suggesting previous finding of esophagitis and start on PPI twice daily and sucralfate  Patient's 3 troponins are negative-and the chest pain is atypical.   2.  Sepsis,  acute cholecystitis, gram-negative bacteremia Patient had fever and blood cultures reported gram-negative bacteremia.  CT scan abdomen reports acute cholecystitis. S/p cholecystectomy. Started on full liquid diet. Tolerated, on soft now.  As per ID recommendation Levaquin + flagyl for 7 days. Follow in surgical clinic.  2. Hypertension: Intermittent control; patient was on lisinopril and hydrochlorothiazide but now has slight worsening in  the renal function and also required contrast for the CT scan so I would hold both of them and start on small dose metoprolol for better blood pressure control.  Also worried that with gram-negative bacteremia and fever his blood pressure might drop so I would not even for very tight blood pressure control currently. BP stable now.  2.  Acute renal failure This could be due to sepsis, infection and cholecystitis, IV fluids and continue to monitor.  Avoid nephrotoxic medications and diuretics.  BP meds changed due to this.  3. Hyperlipidemia: Continue statin therapy 4. BPH: Continue tamsulosin 5. Asthma: Continue inhaled corticosteroid. Albuterol as needed. 6. DVT prophylaxis: Lovenox 7.GI prophylaxis: PPI BID as above.   DISCHARGE CONDITIONS:   Stable.  CONSULTS OBTAINED:  Treatment Team:  Jules Husbands, MD Vaughan Basta, MD  DRUG ALLERGIES:   Allergies  Allergen Reactions  . Erythromycin Nausea And Vomiting  . Sulfa Antibiotics Other (See Comments)    unknown  . Tetracyclines & Related Nausea And Vomiting    DISCHARGE MEDICATIONS:   Allergies as of 05/10/2019      Reactions   Erythromycin Nausea And Vomiting   Sulfa Antibiotics Other (See Comments)   unknown   Tetracyclines & Related Nausea And Vomiting      Medication List    STOP taking these medications   lisinopril-hydrochlorothiazide 20-12.5 MG tablet Commonly known as: ZESTORETIC     TAKE these medications  albuterol 108 (90 Base) MCG/ACT inhaler Commonly known as: VENTOLIN HFA Inhale 1 puff into the lungs every 4 (four) hours as needed for wheezing or shortness of breath.   augmented betamethasone dipropionate 0.05 % ointment Commonly known as: DIPROLENE-AF Apply 1 application topically 2 (two) times daily.   docusate sodium 100 MG capsule Commonly known as: COLACE Take 1 capsule (100 mg total) by mouth 2 (two) times daily.   famotidine 20 MG tablet Commonly known as:  PEPCID Take 20 mg by mouth at bedtime as needed for heartburn.   ferrous sulfate 325 (65 FE) MG tablet Take 325 mg by mouth daily with breakfast.   fexofenadine 180 MG tablet Commonly known as: ALLEGRA Take 180 mg by mouth daily.   finasteride 5 MG tablet Commonly known as: PROSCAR Take 5 mg by mouth daily.   fluticasone 50 MCG/ACT nasal spray Commonly known as: FLONASE Place 2 sprays into both nostrils daily as needed for allergies or rhinitis.   glucosamine-chondroitin 500-400 MG tablet Take 1 tablet by mouth daily.   hydrocortisone cream 1 % Apply 1 application topically 2 (two) times daily.   ipratropium 0.06 % nasal spray Commonly known as: ATROVENT Place 2 sprays into both nostrils 3 (three) times daily.   levofloxacin 500 MG tablet Commonly known as: Levaquin Take 1 tablet (500 mg total) by mouth daily for 7 days.   Melatonin 10 MG Tabs Take 1 tablet by mouth at bedtime.   metoprolol tartrate 25 MG tablet Commonly known as: LOPRESSOR Take 0.5 tablets (12.5 mg total) by mouth 2 (two) times daily.   metroNIDAZOLE 250 MG tablet Commonly known as: Flagyl Take 1 tablet (250 mg total) by mouth 3 (three) times daily for 7 days.   naproxen sodium 220 MG tablet Commonly known as: ALEVE Take 220 mg by mouth 2 (two) times daily with a meal.   oxyCODONE 5 MG immediate release tablet Commonly known as: Oxy IR/ROXICODONE Take 1 tablet (5 mg total) by mouth every 6 (six) hours as needed for moderate pain or severe pain.   sucralfate 1 g tablet Commonly known as: CARAFATE Take 1 tablet (1 g total) by mouth 4 (four) times daily -  with meals and at bedtime.   tamsulosin 0.4 MG Caps capsule Commonly known as: FLOMAX Take 0.4 mg by mouth 2 (two) times daily.   TYLENOL 500 MG tablet Generic drug: acetaminophen Take 500 mg by mouth every 6 (six) hours as needed for mild pain.   Wixela Inhub 250-50 MCG/DOSE Aepb Generic drug: Fluticasone-Salmeterol Inhale 1 puff into  the lungs 2 (two) times a day.        DISCHARGE INSTRUCTIONS:    Follow with surgical clinic in 1-2 weeks.  If you experience worsening of your admission symptoms, develop shortness of breath, life threatening emergency, suicidal or homicidal thoughts you must seek medical attention immediately by calling 911 or calling your MD immediately  if symptoms less severe.  You Must read complete instructions/literature along with all the possible adverse reactions/side effects for all the Medicines you take and that have been prescribed to you. Take any new Medicines after you have completely understood and accept all the possible adverse reactions/side effects.   Please note  You were cared for by a hospitalist during your hospital stay. If you have any questions about your discharge medications or the care you received while you were in the hospital after you are discharged, you can call the unit and asked to speak with the  hospitalist on call if the hospitalist that took care of you is not available. Once you are discharged, your primary care physician will handle any further medical issues. Please note that NO REFILLS for any discharge medications will be authorized once you are discharged, as it is imperative that you return to your primary care physician (or establish a relationship with a primary care physician if you do not have one) for your aftercare needs so that they can reassess your need for medications and monitor your lab values.    Today   CHIEF COMPLAINT:   Chief Complaint  Patient presents with  . Abdominal Pain    HISTORY OF PRESENT ILLNESS:  Jacque Garrels  is a 73 y.o. male with a known history of gastric ulcer, hypertension, hyperlipidemia, asthma and BPH presents to the emergency department complaining of abdominal pain.  The patient points to his epigastric region as the source of pain.  He reports that it began while sitting at his computer and gradually worsened  throughout the evening.  He thought eating might help but the pain only worsened.  He admits to some shortness of breath that is slightly worse than baseline but he denies vomiting or diaphoresis.  He admits to one episode of nausea.  Though his high-sensitivity troponin was negative the rate of increase prompted the emergency department staff to call the hospitalist service for further evaluation.  VITAL SIGNS:  Blood pressure (!) 144/77, pulse 71, temperature 97.9 F (36.6 C), temperature source Oral, resp. rate 19, height 5\' 3"  (1.6 m), weight 116.2 kg, SpO2 97 %.  I/O:    Intake/Output Summary (Last 24 hours) at 05/10/2019 1426 Last data filed at 05/10/2019 1332 Gross per 24 hour  Intake 1451.02 ml  Output 1055 ml  Net 396.02 ml    PHYSICAL EXAMINATION:  GENERAL:  73 y.o.-year-old patient lying in the bed with no acute distress.  EYES: Pupils equal, round, reactive to light and accommodation. No scleral icterus. Extraocular muscles intact.  HEENT: Head atraumatic, normocephalic. Oropharynx and nasopharynx clear.  NECK:  Supple, no jugular venous distention. No thyroid enlargement, no tenderness.  LUNGS: Normal breath sounds bilaterally, no wheezing, rales,rhonchi or crepitation. No use of accessory muscles of respiration.  CARDIOVASCULAR: S1, S2 normal. No murmurs, rubs, or gallops.  ABDOMEN: Soft, tender, nondistended. Bowel sounds present. No organomegaly or mass. S/p laproscopy. JP drain. ( surgery is planning to remove drain before discharge.)  EXTREMITIES: No pedal edema, cyanosis, or clubbing.  NEUROLOGIC: Cranial nerves II through XII are intact. Muscle strength 5/5 in all extremities. Sensation intact. Gait not checked.  PSYCHIATRIC: The patient is alert and oriented x 3.  SKIN: No obvious rash, lesion, or ulcer.   DATA REVIEW:   CBC Recent Labs  Lab 05/08/19 0526  WBC 9.2  HGB 11.8*  HCT 35.5*  PLT 139*    Chemistries  Recent Labs  Lab 05/09/19 0804  05/10/19 1326  NA  --  137  K  --  3.6  CL  --  108  CO2  --  22  GLUCOSE  --  130*  BUN  --  29*  CREATININE  --  1.33*  CALCIUM  --  7.7*  AST 54*  --   ALT 66*  --   ALKPHOS 69  --   BILITOT 0.5  --     Cardiac Enzymes No results for input(s): TROPONINI in the last 168 hours.  Microbiology Results  Results for orders placed or performed during  the hospital encounter of 05/06/19  SARS Coronavirus 2 (CEPHEID - Performed in [place] lab), Hosp Order     Status: None   Collection Time: 05/06/19  4:58 AM   Specimen: Nasopharyngeal Swab  Result Value Ref Range Status   SARS Coronavirus 2 NEGATIVE NEGATIVE Final    Comment: (NOTE) If result is NEGATIVE SARS-CoV-2 target nucleic acids are NOT DETECTED. The SARS-CoV-2 RNA is generally detectable in upper and lower  respiratory specimens during the acute phase of infection. The lowest  concentration of SARS-CoV-2 viral copies this assay can detect is 250  copies / mL. A negative result does not preclude SARS-CoV-2 infection  and should not be used as the sole basis for treatment or other  patient management decisions.  A negative result may occur with  improper specimen collection / handling, submission of specimen other  than nasopharyngeal swab, presence of viral mutation(s) within the  areas targeted by this assay, and inadequate number of viral copies  (<250 copies / mL). A negative result must be combined with clinical  observations, patient history, and epidemiological information. If result is POSITIVE SARS-CoV-2 target nucleic acids are DETECTED. The SARS-CoV-2 RNA is generally detectable in upper and lower  respiratory specimens dur ing the acute phase of infection.  Positive  results are indicative of active infection with SARS-CoV-2.  Clinical  correlation with patient history and other diagnostic information is  necessary to determine patient infection status.  Positive results do  not rule out  bacterial infection or co-infection with other viruses. If result is PRESUMPTIVE POSTIVE SARS-CoV-2 nucleic acids MAY BE PRESENT.   A presumptive positive result was obtained on the submitted specimen  and confirmed on repeat testing.  While 2019 novel coronavirus  (SARS-CoV-2) nucleic acids may be present in the submitted sample  additional confirmatory testing may be necessary for epidemiological  and / or clinical management purposes  to differentiate between  SARS-CoV-2 and other Sarbecovirus currently known to infect humans.  If clinically indicated additional testing with an alternate test  methodology [phone]) is advised. The SARS-CoV-2 RNA is generally  detectable in upper and lower respiratory sp ecimens during the acute  phase of infection. The expected result is Negative. Fact Sheet for Patients:  StrictlyIdeas.no Fact Sheet for Healthcare Providers: BankingDealers.co.za This test is not yet approved or cleared by the [place] FDA and has been authorized for detection and/or diagnosis of SARS-CoV-2 by FDA under an Emergency Use Authorization (EUA).  This EUA will remain in effect (meaning this test can be used) for the duration of the COVID-19 declaration under Section 564(b)(1) of the Act, 21 U.S.C. section 360bbb-3(b)(1), unless the authorization is terminated or revoked sooner. Performed at [facility], [address], [place], [place] [address]   SARS Coronavirus 2 (CEPHEID- Performed in [facility] hospital lab), Hosp Order     Status: None   Collection Time: 05/06/19 11:35 PM   Specimen: Nasopharyngeal Swab  Result Value Ref Range Status   SARS Coronavirus 2 NEGATIVE NEGATIVE Final    Comment: (NOTE) If result is NEGATIVE SARS-CoV-2 target nucleic acids are NOT DETECTED. The SARS-CoV-2 RNA is generally detectable in upper and lower  respiratory specimens during the acute phase of infection. The lowest   concentration of SARS-CoV-2 viral copies this assay can detect is 250  copies / mL. A negative result does not preclude SARS-CoV-2 infection  and should not be used as the sole basis for treatment or other  patient management  decisions.  A negative result may occur with  improper specimen collection / handling, submission of specimen other  than nasopharyngeal swab, presence of viral mutation(s) within the  areas targeted by this assay, and inadequate number of viral copies  (<250 copies / mL). A negative result must be combined with clinical  observations, patient history, and epidemiological information. If result is POSITIVE SARS-CoV-2 target nucleic acids are DETECTED. The SARS-CoV-2 RNA is generally detectable in upper and lower  respiratory specimens dur ing the acute phase of infection.  Positive  results are indicative of active infection with SARS-CoV-2.  Clinical  correlation with patient history and other diagnostic information is  necessary to determine patient infection status.  Positive results do  not rule out bacterial infection or co-infection with other viruses. If result is PRESUMPTIVE POSTIVE SARS-CoV-2 nucleic acids MAY BE PRESENT.   A presumptive positive result was obtained on the submitted specimen  and confirmed on repeat testing.  While 2019 novel coronavirus  (SARS-CoV-2) nucleic acids may be present in the submitted sample  additional confirmatory testing may be necessary for epidemiological  and / or clinical management purposes  to differentiate between  SARS-CoV-2 and other Sarbecovirus currently known to infect humans.  If clinically indicated additional testing with an alternate test  methodology [phone]) is advised. The SARS-CoV-2 RNA is generally  detectable in upper and lower respiratory sp ecimens during the acute  phase of infection. The expected result is Negative. Fact Sheet for Patients:  StrictlyIdeas.no Fact Sheet  for Healthcare Providers: BankingDealers.co.za This test is not yet approved or cleared by the [place] FDA and has been authorized for detection and/or diagnosis of SARS-CoV-2 by FDA under an Emergency Use Authorization (EUA).  This EUA will remain in effect (meaning this test can be used) for the duration of the COVID-19 declaration under Section 564(b)(1) of the Act, 21 U.S.C. section 360bbb-3(b)(1), unless the authorization is terminated or revoked sooner. Performed at [facility], [address] [place], [place] [address]   Culture, blood (Routine X 2) w Reflex to ID Panel     Status: Abnormal   Collection Time: 05/07/19  1:03 AM   Specimen: BLOOD  Result Value Ref Range Status   Specimen Description   Final    BLOOD LEFT ANTECUBITAL Performed at [facility], [address] [place], [place] [address]    Special Requests   Final    BOTTLES DRAWN AEROBIC AND ANAEROBIC Blood Culture adequate volume Performed at [facility], [place], [place] [address]    Culture  Setup Time   Final    GRAM NEGATIVE RODS IN BOTH AEROBIC AND ANAEROBIC BOTTLES CRITICAL RESULT CALLED TO, READ BACK BY AND VERIFIED WITH: LISA KLUTTZ AT 1243 05/07/2019 SDR Performed at [facility], [place] [address], [place], [place] [address]    Culture KLEBSIELLA PNEUMONIAE (A)  Final   Report Status 05/09/2019 FINAL  Final   Organism ID, Bacteria KLEBSIELLA PNEUMONIAE  Final      Susceptibility   Klebsiella pneumoniae - MIC*    AMPICILLIN >=32 RESISTANT Resistant     CEFAZOLIN <=4 SENSITIVE Sensitive     CEFEPIME <=1 SENSITIVE Sensitive     CEFTAZIDIME <=1 SENSITIVE Sensitive     CEFTRIAXONE <=1 SENSITIVE Sensitive     CIPROFLOXACIN 1 SENSITIVE Sensitive     GENTAMICIN <=1 SENSITIVE Sensitive     IMIPENEM <=0.25 SENSITIVE Sensitive     TRIMETH/SULFA <=20 SENSITIVE Sensitive  AMPICILLIN/SULBACTAM 8 SENSITIVE Sensitive      PIP/TAZO 16 SENSITIVE Sensitive     Extended ESBL NEGATIVE Sensitive     * KLEBSIELLA PNEUMONIAE  Culture, blood (Routine X 2) w Reflex to ID Panel     Status: Abnormal   Collection Time: 05/07/19  1:03 AM   Specimen: BLOOD LEFT HAND  Result Value Ref Range Status   Specimen Description   Final    BLOOD LEFT HAND Performed at [facility], [place] [address], [place], [place] [address]    Special Requests   Final    BOTTLES DRAWN AEROBIC AND ANAEROBIC Blood Culture adequate volume Performed at [facility], [address] [place], [place] [address]    Culture  Setup Time   Final    GRAM NEGATIVE RODS AEROBIC BOTTLE ONLY CRITICAL VALUE NOTED.  VALUE IS CONSISTENT WITH PREVIOUSLY REPORTED AND CALLED VALUE. Performed at [facility], [address] [place], [place] [address]    Culture KLEBSIELLA PNEUMONIAE (A)  Final   Report Status 05/09/2019 FINAL  Final  Blood Culture ID Panel (Reflexed)     Status: Abnormal   Collection Time: 05/07/19  1:03 AM  Result Value Ref Range Status   Enterococcus species NOT DETECTED NOT DETECTED Final   Listeria monocytogenes NOT DETECTED NOT DETECTED Final   Staphylococcus species NOT DETECTED NOT DETECTED Final   Staphylococcus aureus (BCID) NOT DETECTED NOT DETECTED Final   Streptococcus species NOT DETECTED NOT DETECTED Final   Streptococcus agalactiae NOT DETECTED NOT DETECTED Final   Streptococcus pneumoniae NOT DETECTED NOT DETECTED Final   Streptococcus pyogenes NOT DETECTED NOT DETECTED Final   Acinetobacter baumannii NOT DETECTED NOT DETECTED Final   Enterobacteriaceae species DETECTED (A) NOT DETECTED Final    Comment: Enterobacteriaceae represent a large family of gram-negative bacteria, not a single organism. CRITICAL RESULT CALLED TO, READ BACK BY AND VERIFIED WITH:  LISA KLUTTZ AT 1443 05/07/2019 SDR    Enterobacter cloacae complex NOT DETECTED NOT DETECTED Final   Escherichia coli NOT DETECTED NOT DETECTED  Final   Klebsiella oxytoca NOT DETECTED NOT DETECTED Final   Klebsiella pneumoniae DETECTED (A) NOT DETECTED Final    Comment: CRITICAL RESULT CALLED TO, READ BACK BY AND VERIFIED WITH:  LISA KLUTTZ AT 1243 05/07/2019 SDR    Proteus species NOT DETECTED NOT DETECTED Final   Serratia marcescens NOT DETECTED NOT DETECTED Final   Carbapenem resistance NOT DETECTED NOT DETECTED Final   Haemophilus influenzae NOT DETECTED NOT DETECTED Final   Neisseria meningitidis NOT DETECTED NOT DETECTED Final   Pseudomonas aeruginosa NOT DETECTED NOT DETECTED Final   Candida albicans NOT DETECTED NOT DETECTED Final   Candida glabrata NOT DETECTED NOT DETECTED Final   Candida krusei NOT DETECTED NOT DETECTED Final   Candida parapsilosis NOT DETECTED NOT DETECTED Final   Candida tropicalis NOT DETECTED NOT DETECTED Final    Comment: Performed at [facility], [address] [place], [place] [address]  CULTURE, BLOOD (ROUTINE X 2) w Reflex to ID Panel     Status: None (Preliminary result)   Collection Time: 05/09/19 12:02 AM   Specimen: BLOOD  Result Value Ref Range Status   Specimen Description BLOOD LEFT ASSIST CONTROL  Final   Special Requests   Final    BOTTLES DRAWN AEROBIC AND ANAEROBIC Blood Culture adequate volume   Culture   Final    NO GROWTH 1 DAY Performed at [facility], [address] [place], [place] [address]  Report Status PENDING  Incomplete  CULTURE, BLOOD (ROUTINE X 2) w Reflex to ID Panel     Status: None (Preliminary result)   Collection Time: 05/09/19 12:09 AM   Specimen: BLOOD  Result Value Ref Range Status   Specimen Description BLOOD LEFT HAND  Final   Special Requests   Final    BOTTLES DRAWN AEROBIC AND ANAEROBIC Blood Culture adequate volume   Culture   Final    NO GROWTH 1 DAY Performed at [facility], [address] [place], [place] [address]    Report Status PENDING  Incomplete    RADIOLOGY:  No results found.  EKG:    Orders placed or performed during the hospital encounter of 05/06/19  . ED EKG  . ED EKG  . EKG      Management plans discussed with the patient, family and they are in agreement.  CODE STATUS: full    Code Status Orders  (From admission, onward)         Start     Ordered   05/06/19 0642  Full code  Continuous     05/06/19 0641        Code Status History    This patient has a current code status but no historical code status.   Advance Care Planning Activity    Advance Directive Documentation     Most Recent Value  Type of Advance Directive  Healthcare Power of Attorney, Living will  Pre-existing out of facility DNR order (yellow form or pink MOST form)  -  "MOST" Form in Place?  -      TOTAL TIME TAKING CARE OF THIS PATIENT: 35 minutes.    Vaughan Basta M.D on 05/10/2019 at 2:26 PM  Between 7am to 6pm - Pager - [phone]  After 6pm go to [email or web address] - password [email or web address] [email or web address]  Office  [phone]  CC: Primary care physician; Juluis Pitch, MD   Note: This dictation was prepared with Dragon dictation along with smaller phrase technology. Any transcriptional errors that result from this process are unintentional.

## 2019-05-10 NOTE — Progress Notes (Signed)
[facility] Day(s): 3.   Post op day(s): 3 Days Post-Op.   Interval History: Patient seen and examined, no acute events or new complaints overnight. Patient reports that he is doing well. No reports of fever, chills, nausea, or emesis. He has tolerated a heart healthy diet. Still with bowel function. Working with PT with no recommendations. No other issues.    Vital signs in last 24 hours: [min-max] current  Temp:  [97.7 F (36.5 C)-98.4 F (36.9 C)] 97.9 F (36.6 C) (07/15 0754) Pulse Rate:  [59-77] 71 (07/15 0754) Resp:  [18-20] 19 (07/15 0754) BP: (130-144)/(64-79) 144/77 (07/15 0754) SpO2:  [96 %-98 %] 97 % (07/15 0754) Weight:  [116.2 kg] 116.2 kg (07/15 0324)     Height: 5\' 3"  (160 cm) Weight: 116.2 kg BMI (Calculated): 45.39   Intake/Output last 2 shifts:  07/14 0701 - 07/15 0700 In: 1981 [P.O.:960; I.V.:971; IV Piggyback:50] Out: 331 [Urine:300; Drains:30; Stool:1]   Physical Exam:  Constitutional: alert, cooperative and no distress  Respiratory: breathing non-labored at rest. On [place] Gastrointestinal: soft,incisional tenderness worse at superior portion of mini laparotomy, and non-distended. No rebound/guarding. JP in RUQ with serosanguinous output. Integumentary:Laparotomy and laparoscopic incisions are CDI with honeycomb dressing, no erythema   Labs:  CBC Latest Ref Rng & Units 05/08/2019 05/07/2019 05/05/2019  WBC 4.0 - 10.5 K/uL 9.2 10.7(H) 9.0  Hemoglobin 13.0 - 17.0 g/dL 11.8(L) 13.0 13.7  Hematocrit 39.0 - 52.0 % 35.5(L) 39.3 41.0  Platelets 150 - 400 K/uL 139(L) 136(L) 213   CMP Latest Ref Rng & Units 05/09/2019 05/08/2019 05/07/2019  Glucose 70 - 99 mg/dL - 181(H) 179(H)  BUN 8 - 23 mg/dL - 21 25(H)  Creatinine 0.61 - 1.24 mg/dL - 1.43(H) 1.57(H)  Sodium 135 - 145 mmol/L - 137 135  Potassium 3.5 - 5.1 mmol/L - 4.1 4.1  Chloride 98 - 111 mmol/L - 108 103  CO2 22 - 32 mmol/L - 22 23  Calcium 8.9 - 10.3  mg/dL - 7.5(L) 7.9(L)  Total Protein 6.5 - 8.1 g/dL 5.8(L) 6.3(L) 6.5  Total Bilirubin 0.3 - 1.2 mg/dL 0.5 0.6 1.0  Alkaline Phos 38 - 126 U/L 69 63 67  AST 15 - 41 U/L 54(H) 72(H) 25  ALT 0 - 44 U/L 66(H) 65(H) 26    Imaging studies: No new pertinent imaging studies   Assessment/Plan:  73 y.o. male overall doing well 3 Days Post-Op s/p hand-assisted laparoscopic cholecystectomyfor gangrenous cholecystitis   - continue heart healthy diet + wean/discontinue IVF  - continue IV Abx (Zosyn); will need 10 days of likely Augmentin at discharge   - pain control prn; antiemetics prn - monitor abdominal examination; on-going bowel function   - will remove JP today   - continue mobilization  - further management per primary team   - Discharge planning: okay for discharge today, will follow up with surgery next week, will need ABx + pain medications, discharge instructions reviewed   All of the above findings and recommendations were discussed with the patient, and the medical team, and all of patient's questions were answered to his expressed satisfaction.  -- Edison Simon, PA-C [facility] 05/10/2019, 8:32 AM [phone] M-F: 7am - 4pm

## 2019-05-10 NOTE — Care Management Important Message (Signed)
Important Message  Patient Details  Name: Zachary Chase MRN: [id number] Date of Birth: 1945/11/20   Medicare Important Message Given:  Yes     Dannette Barbara 05/10/2019, 11:17 AM

## 2019-05-10 NOTE — Progress Notes (Signed)
Demonstrated emptying the JP drain with explanation/rationale.

## 2019-05-10 NOTE — Progress Notes (Signed)
Pt to be discharged this afternoon. jp drain removed by surgical . Iv's  Removed. disch instructions and prescrips givento pt. Wife to transport.

## 2019-05-14 LAB — CULTURE, BLOOD (ROUTINE X 2)
Culture: NO GROWTH
Culture: NO GROWTH
Special Requests: ADEQUATE
Special Requests: ADEQUATE

## 2019-05-17 ENCOUNTER — Encounter: Payer: Self-pay | Admitting: Surgery

## 2019-05-17 ENCOUNTER — Ambulatory Visit (INDEPENDENT_AMBULATORY_CARE_PROVIDER_SITE_OTHER): Payer: Medicare Other | Admitting: Surgery

## 2019-05-17 ENCOUNTER — Other Ambulatory Visit: Payer: Self-pay

## 2019-05-17 VITALS — BP 142/83 | HR 86 | Temp 97.5°F | Resp 18 | Ht 63.0 in | Wt 235.8 lb

## 2019-05-17 DIAGNOSIS — Z09 Encounter for follow-up examination after completed treatment for conditions other than malignant neoplasm: Secondary | ICD-10-CM

## 2019-05-17 NOTE — Patient Instructions (Signed)

## 2019-05-17 NOTE — Progress Notes (Signed)
S/p HALS cholecystectomy on July 12 for gangrenous cholecystitis.  He is doing very well.  No fevers no chills taking p.o. No abdominal pain Path d/w pt  PE NAD Abd: Visions healing well.  Staples removed.  No evidence of infection.  A/P doing very well No heavy lifting RTC prn

## 2019-05-25 ENCOUNTER — Other Ambulatory Visit: Payer: Self-pay

## 2019-05-25 ENCOUNTER — Ambulatory Visit: Payer: Medicare Other | Attending: Infectious Diseases | Admitting: Infectious Diseases

## 2019-05-25 ENCOUNTER — Encounter: Payer: Self-pay | Admitting: Infectious Diseases

## 2019-05-25 VITALS — BP 118/75 | HR 80 | Temp 97.3°F | Wt 234.0 lb

## 2019-05-25 DIAGNOSIS — Z9049 Acquired absence of other specified parts of digestive tract: Secondary | ICD-10-CM | POA: Diagnosis not present

## 2019-05-25 DIAGNOSIS — Z09 Encounter for follow-up examination after completed treatment for conditions other than malignant neoplasm: Secondary | ICD-10-CM

## 2019-05-25 DIAGNOSIS — B961 Klebsiella pneumoniae [K. pneumoniae] as the cause of diseases classified elsewhere: Secondary | ICD-10-CM

## 2019-05-25 DIAGNOSIS — Z881 Allergy status to other antibiotic agents status: Secondary | ICD-10-CM | POA: Diagnosis not present

## 2019-05-25 DIAGNOSIS — Z8619 Personal history of other infectious and parasitic diseases: Secondary | ICD-10-CM

## 2019-05-25 DIAGNOSIS — K810 Acute cholecystitis: Secondary | ICD-10-CM | POA: Insufficient documentation

## 2019-05-25 DIAGNOSIS — R7881 Bacteremia: Secondary | ICD-10-CM | POA: Insufficient documentation

## 2019-05-25 NOTE — Patient Instructions (Addendum)
You are here for follow up after hospital discharge. You had gall baldder inflammation and klebsiella in the blood- you had gall bladder removed and you took antibiotics . You have finished levaquin and flagyl and doing well- Watch your diet and follow up with PCP. You have an appt with him tomorrow- need to check blood work- CBC/CMP

## 2019-05-25 NOTE — Progress Notes (Signed)
NAME: Zachary Chase  DOB: 08-04-1946  MRN: [id number]  Date/Time: 05/25/2019 11:09 AM   Subjective:   ?Follow-up after recent hospitalization.  Zachary Chase is a 73 y.o. male with a history of asthma, hypertension, BPH Was recently admitted to the hospital for abdominal pain and found to have acute cholecystitis and underwent laparoscopic cholecystectomy on the gallbladder was found to be gangrenous.  His blood culture was positive for Klebsiella.  Initial antibiotics were intravenous and on discharge he was sent home on p.o. Levaquin and p.o. Flagyl to complete 7 days of treatment.  He finished a treatment on 05/17/2019.  And has been doing very well since then.  He does not have any pain abdomen.  Some diarrhea.  No fever or chills.  He is changed his diet drastically and trying to avoid all greasy and fatty foods. He has followed up with surgeon as outpatient Past Medical History:  Diagnosis Date  . Anemia   . Arthritis   . Asthma   . Back pain   . BPH (benign prostatic hyperplasia)   . Dermatophytosis of nail   . Enthesopathy of hip region   . Gastric ulcer   . H/O multiple allergies   . History of colonic polyps   . Hx of onychia and paronychia   . Hypersomnia   . Hypertension   . Lower extremity edema   . Pneumonia   . Prostatic hypertrophy   . Pure hypercholesterolemia   . Sleep apnea   . Testicular hypofunction   . Tremor    of the head  . Wheezing     Past Surgical History:  Procedure Laterality Date  . CATARACT EXTRACTION W/PHACO Right 09/01/2016   Procedure: CATARACT EXTRACTION PHACO AND INTRAOCULAR LENS PLACEMENT (IOC);  Surgeon: Birder Robson, MD;  Location: [facility] ORS;  Service: Ophthalmology;  Laterality: Right;  Lot# [id number] H [place]: 00:34.0 AP%:21.8 CDE: 7.39   . CHOLECYSTECTOMY N/A 05/07/2019   Procedure: LAPAROSCOPIC CHOLECYSTECTOMY;  Surgeon: Jules Husbands, MD;  Location: [facility] ORS;  Service: General;  Laterality: N/A;  . COLONOSCOPY WITH  PROPOFOL N/A 11/30/2018   Procedure: COLONOSCOPY WITH PROPOFOL;  Surgeon: Manya Silvas, MD;  Location: [facility] ENDOSCOPY;  Service: Endoscopy;  Laterality: N/A;  . ESOPHAGOGASTRODUODENOSCOPY (EGD) WITH PROPOFOL N/A 11/30/2018   Procedure: ESOPHAGOGASTRODUODENOSCOPY (EGD) WITH PROPOFOL;  Surgeon: Manya Silvas, MD;  Location: [facility] ENDOSCOPY;  Service: Endoscopy;  Laterality: N/A;  . sleep apnea surgery    . TONSILLECTOMY      Social History   Socioeconomic History  . Marital status: Married    Spouse name: Not on file  . Number of children: Not on file  . Years of education: Not on file  . Highest education level: Not on file  Occupational History  . Not on file  Social Needs  . Financial resource strain: Not on file  . Food insecurity    Worry: Not on file    Inability: Not on file  . Transportation needs    Medical: Not on file    Non-medical: Not on file  Tobacco Use  . Smoking status: Former Research scientist (life sciences)  . Smokeless tobacco: Never Used  Substance and Sexual Activity  . Alcohol use: Yes    Comment: occasional none last 24hrs  . Drug use: Not Currently  . Sexual activity: Not on file  Lifestyle  . Physical activity    Days per week: Not on file    Minutes per session: Not on file  . Stress:  Not on file  Relationships  . Social Herbalist on phone: Not on file    Gets together: Not on file    Attends religious service: Not on file    Active member of club or organization: Not on file    Attends meetings of clubs or organizations: Not on file    Relationship status: Not on file  . Intimate partner violence    Fear of current or ex partner: Not on file    Emotionally abused: Not on file    Physically abused: Not on file    Forced sexual activity: Not on file  Other Topics Concern  . Not on file  Social History Narrative  . Not on file    No family history on file. Allergies  Allergen Reactions  . Erythromycin Nausea And Vomiting  . Sulfa Antibiotics  Other (See Comments)    unknown  . Tetracyclines & Related Nausea And Vomiting    ? Current Outpatient Medications  Medication Sig Dispense Refill  . albuterol (PROVENTIL HFA;VENTOLIN HFA) 108 (90 Base) MCG/ACT inhaler Inhale 1 puff into the lungs every 4 (four) hours as needed for wheezing or shortness of breath.    Marland Kitchen augmented betamethasone dipropionate (DIPROLENE-AF) 0.05 % ointment Apply 1 application topically 2 (two) times daily.    . famotidine (PEPCID) 20 MG tablet Take 20 mg by mouth at bedtime as needed for heartburn.     . ferrous sulfate 325 (65 FE) MG tablet Take 325 mg by mouth daily with breakfast.    . fexofenadine (ALLEGRA) 180 MG tablet Take 180 mg by mouth daily.    . finasteride (PROSCAR) 5 MG tablet Take 5 mg by mouth daily.    . fluticasone (FLONASE) 50 MCG/ACT nasal spray Place 2 sprays into both nostrils daily as needed for allergies or rhinitis.     Marland Kitchen glucosamine-chondroitin 500-400 MG tablet Take 1 tablet by mouth daily.     . hydrocortisone cream 1 % Apply 1 application topically 2 (two) times daily.    . Melatonin 10 MG TABS Take 1 tablet by mouth at bedtime.    . naproxen sodium (ALEVE) 220 MG tablet Take 220 mg by mouth 2 (two) times daily with a meal.     . tamsulosin (FLOMAX) 0.4 MG CAPS capsule Take 0.4 mg by mouth 2 (two) times daily.    Marland Kitchen acetaminophen (TYLENOL) 500 MG tablet Take 500 mg by mouth every 6 (six) hours as needed for mild pain.     Marland Kitchen docusate sodium (COLACE) 100 MG capsule Take 1 capsule (100 mg total) by mouth 2 (two) times daily. (Patient not taking: Reported on 05/25/2019) 10 capsule 0  . ipratropium (ATROVENT) 0.06 % nasal spray Place 2 sprays into both nostrils 3 (three) times daily.     . metoprolol tartrate (LOPRESSOR) 25 MG tablet Take 0.5 tablets (12.5 mg total) by mouth 2 (two) times daily. (Patient not taking: Reported on 05/25/2019) 30 tablet 0  . oxyCODONE (OXY IR/ROXICODONE) 5 MG immediate release tablet Take 1 tablet (5 mg total) by  mouth every 6 (six) hours as needed for moderate pain or severe pain. (Patient not taking: Reported on 05/17/2019) 20 tablet 0  . sucralfate (CARAFATE) 1 g tablet Take 1 tablet (1 g total) by mouth 4 (four) times daily -  with meals and at bedtime. (Patient not taking: Reported on 05/17/2019) 90 tablet 0  . WIXELA INHUB 250-50 MCG/DOSE AEPB Inhale 1 puff into the lungs  2 (two) times a day.     No current facility-administered medications for this visit.      Abtx:  Anti-infectives (From admission, onward)   None      REVIEW OF SYSTEMS:  Const: negative fever, negative chills, negative weight loss Eyes: negative diplopia or visual changes, negative eye pain ENT: negative coryza, negative sore throat Resp: negative cough, hemoptysis, dyspnea Cards: negative for chest pain, palpitations, lower extremity edema GU: negative for frequency, dysuria and hematuria GI: Negative for abdominal pain, , bleeding, constipation Skin: negative for rash and pruritus Heme: negative for easy bruising and gum/nose bleeding MS: negative for myalgias, arthralgias, back pain and muscle weakness Neurolo:negative for headaches, dizziness, vertigo, memory problems  Psych: negative for feelings of anxiety, depression  Endocrine: negative for thyroid, diabetes Allergy/Immunology-as above Objective:  VITALS:  BP 118/75 (BP Location: Left Arm, Patient Position: Sitting, Cuff Size: Large)   Pulse 80   Temp (!) 97.3 F (36.3 C) (Oral)   Wt 234 lb (106.1 kg)   BMI 41.45 kg/m  PHYSICAL EXAM:  General: Alert, cooperative, no distress, appears stated age.  Head: Normocephalic, without obvious abnormality, atraumatic. Eyes: Conjunctivae clear, anicteric sclerae. Pupils are equal ENT did not examine because of mask Neck: Supple, symmetrical, no adenopathy, thyroid: non tender no carotid bruit and no JVD. Back: No CVA tenderness. Lungs: Occasional rhonchi.  Breath sounds heard on both sides Heart: Regular rate  and rhythm, no murmur, rub or gallop. Abdomen: Soft, non-tender,not distended.  Lap site healed well  extremities: atraumatic, no cyanosis. No edema. No clubbing has compression socks Skin: No rashes or lesions. Or bruising Lymph: Cervical, supraclavicular normal. Neurologic: Grossly non-focal Pertinent Labs Lab Results CBC    Component Value Date/Time   WBC 9.2 05/08/2019 0526   RBC 3.89 (L) 05/08/2019 0526   HGB 11.8 (L) 05/08/2019 0526   HGB 12.2 (L) 12/16/2013 0348   HCT 35.5 (L) 05/08/2019 0526   HCT 36.4 (L) 12/16/2013 0348   PLT 139 (L) 05/08/2019 0526   PLT 194 12/16/2013 0348   MCV 91.3 05/08/2019 0526   MCV 92 12/16/2013 0348   MCH 30.3 05/08/2019 0526   MCHC 33.2 05/08/2019 0526   RDW 14.4 05/08/2019 0526   RDW 14.5 12/16/2013 0348   LYMPHSABS 1.7 12/16/2013 0348   MONOABS 0.9 12/16/2013 0348   EOSABS 0.3 12/16/2013 0348   BASOSABS 0.1 12/16/2013 0348    CMP Latest Ref Rng & Units 05/10/2019 05/09/2019 05/08/2019  Glucose 70 - 99 mg/dL 130(H) - 181(H)  BUN 8 - 23 mg/dL 29(H) - 21  Creatinine 0.61 - 1.24 mg/dL 1.33(H) - 1.43(H)  Sodium 135 - 145 mmol/L 137 - 137  Potassium 3.5 - 5.1 mmol/L 3.6 - 4.1  Chloride 98 - 111 mmol/L 108 - 108  CO2 22 - 32 mmol/L 22 - 22  Calcium 8.9 - 10.3 mg/dL 7.7(L) - 7.5(L)  Total Protein 6.5 - 8.1 g/dL - 5.8(L) 6.3(L)  Total Bilirubin 0.3 - 1.2 mg/dL - 0.5 0.6  Alkaline Phos 38 - 126 U/L - 69 63  AST 15 - 41 U/L - 54(H) 72(H)  ALT 0 - 44 U/L - 66(H) 65(H)      Microbiology: No results found for this or any previous visit (from the past 240 hour(s)).  IMAGING RESULTS: I have personally reviewed the films ? Impression/Recommendation ?73 year old male recent hospitalization for acute cholecystitis.  Gangrenous cholecystitis status post lap cholecystectomy.  He is doing very well with no abdominal pain.  Klebsiella bacteremia has been adequately treated with antibiotics.  Infection is resolved completely.  As he last took a  week of Levaquin and completed it on 05/17/2019. ? I did not need to see him again.  ? ___________________________________________________ Discussed with patient, Note:  This document was prepared using Dragon voice recognition software and may include unintentional dictation errors.

## 2019-06-28 ENCOUNTER — Encounter: Payer: Self-pay | Admitting: *Deleted

## 2019-07-10 ENCOUNTER — Other Ambulatory Visit: Payer: Self-pay

## 2019-07-10 ENCOUNTER — Other Ambulatory Visit
Admission: RE | Admit: 2019-07-10 | Discharge: 2019-07-10 | Disposition: A | Discharge status: Home / Self Care | Payer: Medicare Other | Source: Ambulatory Visit | Attending: Ophthalmology | Admitting: Ophthalmology

## 2019-07-10 DIAGNOSIS — Z01812 Encounter for preprocedural laboratory examination: Secondary | ICD-10-CM | POA: Diagnosis present

## 2019-07-10 DIAGNOSIS — Z20828 Contact with and (suspected) exposure to other viral communicable diseases: Secondary | ICD-10-CM | POA: Diagnosis not present

## 2019-07-11 LAB — SARS CORONAVIRUS 2 (TAT 6-24 HRS): SARS Coronavirus 2: NEGATIVE

## 2019-07-13 ENCOUNTER — Encounter
Admission: RE | Disposition: A | Discharge status: Home / Self Care | Payer: Self-pay | Source: Home / Self Care | Attending: Ophthalmology

## 2019-07-13 ENCOUNTER — Ambulatory Visit: Payer: Medicare Other | Admitting: Certified Registered"

## 2019-07-13 ENCOUNTER — Encounter: Payer: Self-pay | Admitting: *Deleted

## 2019-07-13 ENCOUNTER — Ambulatory Visit
Admission: RE | Admit: 2019-07-13 | Discharge: 2019-07-13 | Disposition: A | Discharge status: Home / Self Care | Payer: Medicare Other | Attending: Ophthalmology | Admitting: Ophthalmology

## 2019-07-13 ENCOUNTER — Other Ambulatory Visit: Payer: Self-pay

## 2019-07-13 DIAGNOSIS — Z87891 Personal history of nicotine dependence: Secondary | ICD-10-CM | POA: Diagnosis not present

## 2019-07-13 DIAGNOSIS — I10 Essential (primary) hypertension: Secondary | ICD-10-CM | POA: Insufficient documentation

## 2019-07-13 DIAGNOSIS — L409 Psoriasis, unspecified: Secondary | ICD-10-CM | POA: Insufficient documentation

## 2019-07-13 DIAGNOSIS — H2512 Age-related nuclear cataract, left eye: Secondary | ICD-10-CM | POA: Insufficient documentation

## 2019-07-13 DIAGNOSIS — N400 Enlarged prostate without lower urinary tract symptoms: Secondary | ICD-10-CM | POA: Diagnosis not present

## 2019-07-13 DIAGNOSIS — D649 Anemia, unspecified: Secondary | ICD-10-CM | POA: Diagnosis not present

## 2019-07-13 DIAGNOSIS — K219 Gastro-esophageal reflux disease without esophagitis: Secondary | ICD-10-CM | POA: Diagnosis not present

## 2019-07-13 DIAGNOSIS — Z79899 Other long term (current) drug therapy: Secondary | ICD-10-CM | POA: Insufficient documentation

## 2019-07-13 HISTORY — DX: Psoriasis, unspecified: L40.9

## 2019-07-13 HISTORY — DX: Gastro-esophageal reflux disease without esophagitis: K21.9

## 2019-07-13 HISTORY — PX: CATARACT EXTRACTION W/PHACO: SHX586

## 2019-07-13 SURGERY — PHACOEMULSIFICATION, CATARACT, WITH IOL INSERTION
Anesthesia: Monitor Anesthesia Care | Site: Eye | Laterality: Left

## 2019-07-13 MED ORDER — ARMC OPHTHALMIC DILATING DROPS
OPHTHALMIC | Status: AC
  Administered 2019-07-13: 1 via OPHTHALMIC
  Filled 2019-07-13: qty 0.5

## 2019-07-13 MED ORDER — EPINEPHRINE PF 1 MG/ML IJ SOLN
INTRAMUSCULAR | Status: AC
  Filled 2019-07-13: qty 1

## 2019-07-13 MED ORDER — NA CHONDROIT SULF-NA HYALURON 40-17 MG/ML IO SOLN
INTRAOCULAR | Status: AC
  Filled 2019-07-13: qty 1

## 2019-07-13 MED ORDER — CARBACHOL 0.01 % IO SOLN
INTRAOCULAR | Status: DC | PRN
  Administered 2019-07-13: .5 mL via INTRAOCULAR

## 2019-07-13 MED ORDER — LIDOCAINE HCL (PF) 4 % IJ SOLN
INTRAMUSCULAR | Status: AC
  Filled 2019-07-13: qty 5

## 2019-07-13 MED ORDER — EPINEPHRINE PF 1 MG/ML IJ SOLN
INTRAOCULAR | Status: DC | PRN
  Administered 2019-07-13: 1 mL via OPHTHALMIC

## 2019-07-13 MED ORDER — POVIDONE-IODINE 5 % OP SOLN
OPHTHALMIC | Status: AC
  Filled 2019-07-13: qty 30

## 2019-07-13 MED ORDER — NA CHONDROIT SULF-NA HYALURON 40-17 MG/ML IO SOLN
INTRAOCULAR | Status: DC | PRN
  Administered 2019-07-13: 1 mL via INTRAOCULAR

## 2019-07-13 MED ORDER — MIDAZOLAM HCL 2 MG/2ML IJ SOLN
INTRAMUSCULAR | Status: AC
  Filled 2019-07-13: qty 2

## 2019-07-13 MED ORDER — ARMC OPHTHALMIC DILATING DROPS
1.0000 "application " | OPHTHALMIC | Status: AC
  Administered 2019-07-13 (×3): 1 via OPHTHALMIC

## 2019-07-13 MED ORDER — SODIUM CHLORIDE 0.9 % IV SOLN
INTRAVENOUS | Status: DC
  Administered 2019-07-13: 08:00:00 via INTRAVENOUS

## 2019-07-13 MED ORDER — TETRACAINE HCL 0.5 % OP SOLN
OPHTHALMIC | Status: AC
  Administered 2019-07-13: 08:00:00 1 [drp] via OPHTHALMIC
  Filled 2019-07-13: qty 4

## 2019-07-13 MED ORDER — POVIDONE-IODINE 5 % OP SOLN
OPHTHALMIC | Status: DC | PRN
  Administered 2019-07-13: 1 via OPHTHALMIC

## 2019-07-13 MED ORDER — LIDOCAINE HCL (PF) 4 % IJ SOLN
INTRAOCULAR | Status: DC | PRN
  Administered 2019-07-13: 2.25 mL via OPHTHALMIC

## 2019-07-13 MED ORDER — MOXIFLOXACIN HCL 0.5 % OP SOLN
OPHTHALMIC | Status: AC
  Filled 2019-07-13: qty 3

## 2019-07-13 MED ORDER — TETRACAINE HCL 0.5 % OP SOLN
1.0000 [drp] | OPHTHALMIC | Status: DC | PRN
  Administered 2019-07-13: 08:00:00 1 [drp] via OPHTHALMIC

## 2019-07-13 MED ORDER — MOXIFLOXACIN HCL 0.5 % OP SOLN
OPHTHALMIC | Status: DC | PRN
  Administered 2019-07-13: .2 mL via OPHTHALMIC

## 2019-07-13 MED ORDER — MOXIFLOXACIN HCL 0.5 % OP SOLN
1.0000 [drp] | Freq: Once | OPHTHALMIC | Status: DC
  Filled 2019-07-13: qty 3

## 2019-07-13 MED ORDER — ONDANSETRON HCL 4 MG/2ML IJ SOLN
INTRAMUSCULAR | Status: DC | PRN
  Administered 2019-07-13: 4 mg via INTRAVENOUS

## 2019-07-13 MED ORDER — MIDAZOLAM HCL 2 MG/2ML IJ SOLN
INTRAMUSCULAR | Status: DC | PRN
  Administered 2019-07-13 (×2): 0.5 mg via INTRAVENOUS

## 2019-07-13 MED ORDER — FENTANYL CITRATE (PF) 100 MCG/2ML IJ SOLN
INTRAMUSCULAR | Status: AC
  Filled 2019-07-13: qty 2

## 2019-07-13 SURGICAL SUPPLY — 16 items
GLOVE BIO SURGEON STRL SZ8 (GLOVE) ×3 IMPLANT
GLOVE BIOGEL M 6.5 STRL (GLOVE) ×3 IMPLANT
GLOVE SURG LX 8.0 MICRO (GLOVE) ×2
GLOVE SURG LX STRL 8.0 MICRO (GLOVE) ×1 IMPLANT
GOWN STRL REUS W/ TWL LRG LVL3 (GOWN DISPOSABLE) ×2 IMPLANT
GOWN STRL REUS W/TWL LRG LVL3 (GOWN DISPOSABLE) ×4
LABEL CATARACT MEDS ST (LABEL) ×3 IMPLANT
LENS IOL TECNIS ITEC 21.5 (Intraocular Lens) ×2 IMPLANT
PACK CATARACT (MISCELLANEOUS) ×3 IMPLANT
PACK CATARACT BRASINGTON LX (MISCELLANEOUS) ×3 IMPLANT
PACK EYE AFTER SURG (MISCELLANEOUS) ×3 IMPLANT
SOL BSS BAG (MISCELLANEOUS) ×3
SOLUTION BSS BAG (MISCELLANEOUS) ×1 IMPLANT
SYR 5ML LL (SYRINGE) ×3 IMPLANT
WATER STERILE IRR 250ML POUR (IV SOLUTION) ×3 IMPLANT
WIPE NON LINTING 3.25X3.25 (MISCELLANEOUS) ×3 IMPLANT

## 2019-07-13 NOTE — Transfer of Care (Signed)
Immediate Anesthesia Transfer of Care Note  Patient: Zachary Chase  Procedure(s) Performed: CATARACT EXTRACTION PHACO AND INTRAOCULAR LENS PLACEMENT (IOC) LEFT (Left Eye)  Patient Location: PACU  Anesthesia Type:MAC  Level of Consciousness: awake, alert , oriented and patient cooperative  Airway & Oxygen Therapy: Patient Spontanous Breathing  Post-op Assessment: Report given to RN and Post -op Vital signs reviewed and stable  Post vital signs: Reviewed and stable  Last Vitals:  Vitals Value Taken Time  BP    Temp    Pulse    Resp    SpO2      Last Pain:  Vitals:   07/13/19 0900  TempSrc: Temporal  PainSc:          Complications: No apparent anesthesia complications

## 2019-07-13 NOTE — Discharge Instructions (Addendum)
Eye Surgery Discharge Instructions  Expect mild scratchy sensation or mild soreness. DO NOT RUB YOUR EYE!  The day of surgery:  Minimal physical activity, but bed rest is not required  No reading, computer work, or close hand work  No bending, lifting, or straining.  May watch TV  For 24 hours:  No driving, legal decisions, or alcoholic beverages  Safety precautions  Eat anything you prefer: It is better to start with liquids, then soup then solid foods.  Solar shield eyeglasses should be worn for comfort in the sunlight/patch while sleeping  Resume all regular medications including aspirin or Coumadin if these were discontinued prior to surgery. You may shower, bathe, shave, or wash your hair. Tylenol may be taken for mild discomfort. Follow eye drop instruction sheet as reviewed.  Call your doctor if you experience significant pain, nausea, or vomiting, fever > 101 or other signs of infection. [phone] or [phone] Specific instructions:  Follow-up Information    Birder Robson, MD Follow up.   Specialty: Ophthalmology Why: 07-14-19 @ 11:15 am  Contact information: [address] [place] [address] [phone]

## 2019-07-13 NOTE — Anesthesia Postprocedure Evaluation (Signed)
Anesthesia Post Note  Patient: Zachary Chase  Procedure(s) Performed: CATARACT EXTRACTION PHACO AND INTRAOCULAR LENS PLACEMENT (IOC) LEFT (Left Eye)  Patient location during evaluation: Phase II Anesthesia Type: MAC Level of consciousness: awake and alert Pain management: pain level controlled Vital Signs Assessment: post-procedure vital signs reviewed and stable Respiratory status: spontaneous breathing, nonlabored ventilation and respiratory function stable Cardiovascular status: blood pressure returned to baseline and stable Postop Assessment: no apparent nausea or vomiting Anesthetic complications: no     Last Vitals:  Vitals:   07/13/19 0754 07/13/19 0900  BP: 124/68 (!) 109/57  Pulse: 70 61  Resp: 18 16  Temp: 36.9 C (!) 36.2 C  SpO2: 100% 100%    Last Pain:  Vitals:   07/13/19 0900  TempSrc: Temporal  PainSc:                  Alphonsus Sias

## 2019-07-13 NOTE — H&P (Signed)
All labs reviewed. Abnormal studies sent to patients PCP when indicated.  Previous H&P reviewed, patient examined, there are NO CHANGES.  Stanton Kissoon Porfilio9/17/20208:41 AM

## 2019-07-13 NOTE — Anesthesia Preprocedure Evaluation (Signed)
Anesthesia Evaluation  Patient identified by MRN, date of birth, ID band Patient awake    Reviewed: Allergy & Precautions, H&P , NPO status , reviewed documented beta blocker date and time   Airway Mallampati: III  TM Distance: >3 FB     Dental  (+) Chipped   Pulmonary asthma , sleep apnea , pneumonia, former smoker,  Pt states OSA resolved post surgery   Pulmonary exam normal        Cardiovascular hypertension, Normal cardiovascular exam  RBBB on EKG   Neuro/Psych    GI/Hepatic PUD, GERD  Medicated and Controlled,  Endo/Other    Renal/GU      Musculoskeletal  (+) Arthritis ,   Abdominal   Peds  Hematology  (+) Blood dyscrasia, anemia ,   Anesthesia Other Findings Past Medical History: No date: Anemia No date: Arthritis No date: Asthma No date: Back pain No date: BPH (benign prostatic hyperplasia) No date: Dermatophytosis of nail No date: Enthesopathy of hip region No date: Gastric ulcer No date: GERD (gastroesophageal reflux disease) No date: H/O multiple allergies No date: History of colonic polyps No date: Hx of onychia and paronychia No date: Hypersomnia No date: Hypertension No date: Lower extremity edema No date: Pneumonia No date: Prostatic hypertrophy No date: Psoriasis No date: Pure hypercholesterolemia No date: Sleep apnea 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 No date: Tremor     Comment:  of the head No date: Wheezing  Past Surgical History: 09/01/2016: CATARACT EXTRACTION W/PHACO; Right     Comment:  Procedure: CATARACT EXTRACTION PHACO AND INTRAOCULAR               LENS PLACEMENT (IOC);  Surgeon: Birder Robson, MD;                Location: [facility] ORS;  Service: Ophthalmology;  Laterality:              Right;  Lot# [id number] H [place]: 00:34.0 AP%:21.8 CDE: 7.39  05/07/2019: CHOLECYSTECTOMY; N/A     Comment:  Procedure: LAPAROSCOPIC CHOLECYSTECTOMY;  Surgeon:               Jules Husbands, MD;   Location: [facility] ORS;  Service:               General;  Laterality: N/A; 11/30/2018: COLONOSCOPY WITH PROPOFOL; N/A     Comment:  Procedure: COLONOSCOPY WITH PROPOFOL;  Surgeon: Manya Silvas, MD;  Location[facility] ENDOSCOPY;  Service:               Endoscopy;  Laterality: N/A; 11/30/2018: ESOPHAGOGASTRODUODENOSCOPY (EGD) WITH PROPOFOL; N/A     Comment:  Procedure: ESOPHAGOGASTRODUODENOSCOPY (EGD) WITH               PROPOFOL;  Surgeon: Manya Silvas, MD;  Location:               [facility] ENDOSCOPY;  Service: Endoscopy;  Laterality: N/A; No date: sleep apnea surgery No date: TONSILLECTOMY  BMI    Body Mass Index: 39.86 kg/m      Reproductive/Obstetrics                             Anesthesia Physical Anesthesia Plan  ASA: III  Anesthesia Plan: MAC   Post-op Pain Management:    Induction: Intravenous  PONV Risk Score and Plan: 1 and Treatment may vary due to age or  medical condition, TIVA and Midazolam  Airway Management Planned: Nasal Cannula and Natural Airway  Additional Equipment:   Intra-op Plan:   Post-operative Plan:   Informed Consent: I have reviewed the patients History and Physical, chart, labs and discussed the procedure including the risks, benefits and alternatives for the proposed anesthesia with the patient or authorized representative who has indicated his/her understanding and acceptance.     Dental Advisory Given  Plan Discussed with: CRNA  Anesthesia Plan Comments:         Anesthesia Quick Evaluation

## 2019-07-13 NOTE — Op Note (Signed)
PREOPERATIVE DIAGNOSIS:  Nuclear sclerotic cataract of the left eye.   POSTOPERATIVE DIAGNOSIS:  Nuclear sclerotic cataract of the left eye.   OPERATIVE PROCEDURE: Procedure(s): CATARACT EXTRACTION PHACO AND INTRAOCULAR LENS PLACEMENT (IOC) LEFT   SURGEON:  Birder Robson, MD.   ANESTHESIA:  Anesthesiologist: Alphonsus Sias, MD CRNA: Kelton Pillar, CRNA  1.      Managed anesthesia care. 2.     0.49ml of Shugarcaine was instilled following the paracentesis   COMPLICATIONS:  None.   TECHNIQUE:   Stop and chop   DESCRIPTION OF PROCEDURE:  The patient was examined and consented in the preoperative holding area where the aforementioned topical anesthesia was applied to the left eye and then brought back to the Operating Room where the left eye was prepped and draped in the usual sterile ophthalmic fashion and a lid speculum was placed. A paracentesis was created with the side port blade and the anterior chamber was filled with viscoelastic. A near clear corneal incision was performed with the steel keratome. A continuous curvilinear capsulorrhexis was performed with a cystotome followed by the capsulorrhexis forceps. Hydrodissection and hydrodelineation were carried out with BSS on a blunt cannula. The lens was removed in a stop and chop  technique and the remaining cortical material was removed with the irrigation-aspiration handpiece. The capsular bag was inflated with viscoelastic and the Technis [id number] lens was placed in the capsular bag without complication. The remaining viscoelastic was removed from the eye with the irrigation-aspiration handpiece. The wounds were hydrated. The anterior chamber was flushed with Miostat and the eye was inflated to physiologic pressure. 0.75ml Vigamox was placed in the anterior chamber. The wounds were found to be water tight. The eye was dressed with Vigamox. The patient was given protective glasses to wear throughout the day and a shield with which  to sleep tonight. The patient was also given drops with which to begin a drop regimen today and will follow-up with me in one day. Implant Name Type Inv. Item Serial No. Manufacturer Lot No. LRB No. Used Action  LENS IOL DIOP 21.5 - [id number] Intraocular Lens LENS IOL DIOP 21.5 [id number]  Left 1 Implanted    Procedure(s) with comments: CATARACT EXTRACTION PHACO AND INTRAOCULAR LENS PLACEMENT (IOC) LEFT (Left) - [place] 00:37 CDE 6.43 Fluid pack lot # [id number]  Electronically signed: Birder Robson 07/13/2019 9:22 AM

## 2019-07-13 NOTE — Anesthesia Post-op Follow-up Note (Signed)
Anesthesia QCDR form completed.        

## 2021-11-19 ENCOUNTER — Other Ambulatory Visit: Payer: Self-pay | Admitting: Family Medicine

## 2021-11-19 ENCOUNTER — Ambulatory Visit
Admission: RE | Admit: 2021-11-19 | Discharge: 2021-11-19 | Disposition: A | Discharge status: Home / Self Care | Payer: Medicare Other | Source: Ambulatory Visit | Attending: Family Medicine | Admitting: Family Medicine

## 2021-11-19 ENCOUNTER — Other Ambulatory Visit: Payer: Self-pay

## 2021-11-19 DIAGNOSIS — N5089 Other specified disorders of the male genital organs: Secondary | ICD-10-CM

## 2021-11-21 ENCOUNTER — Telehealth: Payer: Self-pay | Admitting: Urology

## 2021-11-21 NOTE — Telephone Encounter (Signed)
Can we get him in sooner? Does he need a ultrasound?

## 2021-12-09 NOTE — Progress Notes (Signed)
12/10/21 3:14 PM   Charlotte Crumb [place] 02/19/1946 [id number]  Referring provider:  Juluis Pitch, MD [phone] [place],  [place] [id number] Chief Complaint  Patient presents with   Testicular Mass     HPI: Zachary Chase is a 76 y.o.male who presents today for mass of right testicle.   He was seen by his PCP, Dr Juluis Pitch on 11/18/2021 for enlarged testicles. Scrotal ultrasound revealed stable 2.1 cm right epididymal cyst, interval large right hydrocele, and possible mild left variocele.   Urinalysis in 10/2021 looked suspicious of infection it was not cultured. Urinalysis showed trace blood, nitrite positive, small leukocytes, 10-50 WBCs, and many bacteria.   He reports today that his right gonad is enlarged his noticed this about 6 weeks ago, he is unsure if this was ongoing. He reports that it continues to enlarge. He denies any burning or blood in his urine.   He reports that he just finished Augmentin last Thursday for UTI in January. He reports that he gets UTIs often. He reports that he was asymptomatic.   He had urinary symptoms managed by his primary care including being started on Flomax and more recently oxybutynin.   He says oxybutynin has helped with his urinary symptoms he does not have spurts. He was started on this by his PCP. He has been on oxybutynin for 2-3 months prescribed by his primary care. He was on Mybetriq before but it did not help with his urinary symptoms . He reports that he has seen a urologist he believes it was [facility] for when he was taking Flomax. He was previously on finasteride but it cause sensitivity in his gonads.  He just recently stopped this for short period of time.  He reports that he has been experiencing urinary frequency.   He reports that he would like to fix his scrotal issues.   Most recent PSA 0.96 on 07/2021   PMH: Past Medical History:  Diagnosis Date   Anemia    Arthritis    Asthma    Back pain    BPH  (benign prostatic hyperplasia)    Dermatophytosis of nail    Enthesopathy of hip region    Gastric ulcer    GERD (gastroesophageal reflux disease)    H/O multiple allergies    History of colonic polyps    Hx of onychia and paronychia    Hypersomnia    Hypertension    Lower extremity edema    Pneumonia    Prostatic hypertrophy    Psoriasis    Pure hypercholesterolemia    Sleep apnea    Testicular hypofunction    Tremor    of the head   Wheezing     Surgical History: Past Surgical History:  Procedure Laterality Date   CATARACT EXTRACTION W/PHACO Right 09/01/2016   Procedure: CATARACT EXTRACTION PHACO AND INTRAOCULAR LENS PLACEMENT ([place]);  Surgeon: Birder Robson, MD;  Location: [facility] ORS;  Service: Ophthalmology;  Laterality: Right;  Lot# [id number] [place]: 00:34.0 AP%:21.8 CDE: 7.39    CATARACT EXTRACTION W/PHACO Left 07/13/2019   Procedure: CATARACT EXTRACTION PHACO AND INTRAOCULAR LENS PLACEMENT ([place]) LEFT;  Surgeon: Birder Robson, MD;  Location: [facility] ORS;  Service: Ophthalmology;  Laterality: Left;  [place] 00:37 CDE 6.43 Fluid pack lot # [id number]   CHOLECYSTECTOMY N/A 05/07/2019   Procedure: LAPAROSCOPIC CHOLECYSTECTOMY;  Surgeon: Jules Husbands, MD;  Location: [facility] ORS;  Service: General;  Laterality: N/A;   COLONOSCOPY WITH PROPOFOL N/A 11/30/2018   Procedure: COLONOSCOPY  WITH PROPOFOL;  Surgeon: Manya Silvas, MD;  Location: [facility] ENDOSCOPY;  Service: Endoscopy;  Laterality: N/A;   ESOPHAGOGASTRODUODENOSCOPY (EGD) WITH PROPOFOL N/A 11/30/2018   Procedure: ESOPHAGOGASTRODUODENOSCOPY (EGD) WITH PROPOFOL;  Surgeon: Manya Silvas, MD;  Location: [facility] ENDOSCOPY;  Service: Endoscopy;  Laterality: N/A;   sleep apnea surgery     TONSILLECTOMY      Home Medications:  Allergies as of 12/10/2021       Reactions   Tetracyclines & Related Nausea And Vomiting        Medication List        Accurate as of December 10, 2021  3:14 PM. If you have any questions, ask your nurse  or doctor.          STOP taking these medications    finasteride 5 MG tablet Commonly known as: PROSCAR Stopped by: Hollice Espy, MD   oxybutynin 5 MG tablet Commonly known as: DITROPAN Stopped by: Hollice Espy, MD       TAKE these medications    albuterol 108 (90 Base) MCG/ACT inhaler Commonly known as: VENTOLIN HFA Inhale 1 puff into the lungs every 4 (four) hours as needed for wheezing or shortness of breath.   augmented betamethasone dipropionate 0.05 % ointment Commonly known as: DIPROLENE-AF Apply 1 application topically 2 (two) times daily.   famotidine 10 MG tablet Commonly known as: PEPCID Take 10 mg by mouth as needed for heartburn or indigestion.   ferrous sulfate 325 (65 FE) MG tablet Take 325 mg by mouth daily with breakfast.   fexofenadine 180 MG tablet Commonly known as: ALLEGRA Take 180 mg by mouth daily.   fluticasone 50 MCG/ACT nasal spray Commonly known as: FLONASE Place 2 sprays into both nostrils daily as needed for allergies or rhinitis.   glucosamine-chondroitin 500-400 MG tablet Take 1 tablet by mouth daily.   hydrocortisone cream 1 % Apply 1 application topically 2 (two) times daily.   lisinopril-hydrochlorothiazide 20-12.5 MG tablet Commonly known as: ZESTORETIC Take 1 tablet by mouth daily.   Melatonin 10 MG Tabs Take 1 tablet by mouth at bedtime.   tamsulosin 0.4 MG Caps capsule Commonly known as: FLOMAX Take 0.4 mg by mouth 2 (two) times daily.   Wixela Inhub 250-50 MCG/ACT Aepb Generic drug: fluticasone-salmeterol Inhale 1 puff into the lungs 2 (two) times a day.        Allergies:  Allergies  Allergen Reactions   Tetracyclines & Related Nausea And Vomiting    Family History: History reviewed. No pertinent family history.  Social History:  reports that he has quit smoking. He has never used smokeless tobacco. He reports current alcohol use. He reports that he does not currently use drugs.   Physical  Exam: BP (!) 155/83    Pulse 76    Ht 5\' 3"  (1.6 m)    Wt 218 lb (98.9 kg)    BMI 38.62 kg/m   Constitutional:  Alert and oriented, No acute distress. HEENT: [place] AT, moist mucus membranes.  Trachea midline, no masses. Cardiovascular: No clubbing, cyanosis, or edema. Respiratory: Normal respiratory effort, no increased work of breathing. GU: Baseball sized right hydrocele without underlying skin changes and normal left testicle Skin: No rashes, bruises or suspicious lesions. Neurologic: Grossly intact, no focal deficits, moving all 4 extremities. Psychiatric: Normal mood and affect.  Laboratory Data: Lab Results  Component Value Date   CREATININE 1.33 (H) 05/10/2019   Lab Results  Component Value Date   HGBA1C 6.1 12/15/2013  Urinalysis Urinalysis   Pertinent Imaging: CLINICAL DATA:  Testicular mass for the past 6 weeks, laterality not specified.   EXAM: SCROTAL ULTRASOUND   DOPPLER ULTRASOUND OF THE TESTICLES   TECHNIQUE: Complete ultrasound examination of the testicles, epididymis, and other scrotal structures was performed. Color and spectral Doppler ultrasound were also utilized to evaluate blood flow to the testicles.   COMPARISON:  04/30/2014   FINDINGS: Right testicle   Measurements: 3.8 x 2.5 x 1.9 cm. No mass or microlithiasis visualized.   Left testicle   Measurements: 3.8 x 2.2 x 1.6 cm. No mass or microlithiasis visualized.   Right epididymis: Again demonstrated is a large epididymal cyst measuring 2.1 x 2.1 x 1.5 cm without significant change.   Left epididymis: Mildly heterogeneous without mass or enlargement. Normal internal blood flow.   Hydrocele:  Interval large right hydrocele.   Varicocele:  Possible mild left varicocele.   Pulsed Doppler interrogation of both testes demonstrates normal low resistance arterial and venous waveforms bilaterally.   IMPRESSION: 1. Stable 2.1 cm right epididymal cyst. 2. Interval large right  hydrocele. 3. Possible mild left varicocele.     Electronically Signed   By: Claudie Revering M.D.   On: 11/19/2021 13:10   I have personally reviewed the images and agree with radiologist interpretation.   Results for orders placed or performed in visit on 12/10/21  Microscopic Examination   Urine  Result Value Ref Range   WBC, UA None seen 0 - 5 /hpf   RBC None seen 0 - 2 /hpf   Epithelial Cells (non renal) None seen 0 - 10 /hpf   Bacteria, UA None seen None seen/Few  Urinalysis, Complete  Result Value Ref Range   Specific Gravity, UA 1.020 1.005 - 1.030   pH, UA 5.5 5.0 - 7.5   Color, UA Yellow Yellow   Appearance Ur Clear Clear   Leukocytes,UA Negative Negative   Protein,UA Negative Negative/Trace   Glucose, UA Negative Negative   Ketones, UA Negative Negative   RBC, UA Negative Negative   Bilirubin, UA Negative Negative   Urobilinogen, Ur 0.2 0.2 - 1.0 mg/dL   Nitrite, UA Negative Negative   Microscopic Examination See below:    PVR markedly elevated in the 300 range  Assessment & Plan:    Right Hydrocele  - May be reactive coinciding with positive urinalysis in January  - Discussed the pathophysiology of hydrocele; If this does not resolve will further discuss treatment options he is interested in hydrocelectomy/ spermatocelectomy  - Recommend compressive care such as jock strap or compressive underwear -Reassess in about 6 weeks  2. BPH with urinary frequency/ urgency /incomplete bladder emptying - managed on maximal therapy (only recently stopped finasteride) - in light of current symptoms he has fairly well controlled symptoms - He has had improvement on oxybutynin unfortunately his PVR is markedly elevated and thus in setting of incomplete emptying and recent infection along with reactive hydrocele, anticholinergics are contraindicated - He is not emptying adequately today. Will have him stop oxybutynin as this may be exasperating his symptoms. May further  evaluate this with a cystoscopy.   3. History of UTI - He is asymptomatic and his urinalysis has cleared with antibiotic - Believe his urinary symptoms could be exasperated by infection.     Return in 6 weeks for IPSS/ PVR/ scrotal exam  I,Kailey Littlejohn,acting as a scribe for Hollice Espy, MD.,have documented all relevant documentation on the behalf of Hollice Espy, MD,as directed by  Hollice Espy, MD while in the presence of Hollice Espy, MD.'  I have reviewed the above documentation for accuracy and completeness, and I agree with the above.   Hollice Espy, MD   [facility] [address], [place], [place] [address] [phone]'

## 2021-12-10 ENCOUNTER — Ambulatory Visit (INDEPENDENT_AMBULATORY_CARE_PROVIDER_SITE_OTHER): Payer: Medicare Other | Admitting: Urology

## 2021-12-10 ENCOUNTER — Encounter: Payer: Self-pay | Admitting: Urology

## 2021-12-10 ENCOUNTER — Other Ambulatory Visit: Payer: Self-pay

## 2021-12-10 VITALS — BP 155/83 | HR 76 | Ht 63.0 in | Wt 218.0 lb

## 2021-12-10 DIAGNOSIS — N401 Enlarged prostate with lower urinary tract symptoms: Secondary | ICD-10-CM

## 2021-12-10 DIAGNOSIS — R3914 Feeling of incomplete bladder emptying: Secondary | ICD-10-CM | POA: Diagnosis not present

## 2021-12-10 DIAGNOSIS — N5089 Other specified disorders of the male genital organs: Secondary | ICD-10-CM | POA: Diagnosis not present

## 2021-12-10 LAB — URINALYSIS, COMPLETE
Bilirubin, UA: NEGATIVE
Glucose, UA: NEGATIVE
Ketones, UA: NEGATIVE
Leukocytes,UA: NEGATIVE
Nitrite, UA: NEGATIVE
Protein,UA: NEGATIVE
RBC, UA: NEGATIVE
Specific Gravity, UA: 1.02 (ref 1.005–1.030)
Urobilinogen, Ur: 0.2 mg/dL (ref 0.2–1.0)
pH, UA: 5.5 (ref 5.0–7.5)

## 2021-12-10 LAB — MICROSCOPIC EXAMINATION
Bacteria, UA: NONE SEEN
Epithelial Cells (non renal): NONE SEEN /hpf (ref 0–10)
RBC, Urine: NONE SEEN /hpf (ref 0–2)
WBC, UA: NONE SEEN /hpf (ref 0–5)

## 2022-01-26 NOTE — Progress Notes (Signed)
? ?01/27/22 ?11:05 AM  ? ?Zachary Chase ?[birth year] ?[id number] ? ?Referring provider:  ?Juluis Pitch, MD ?[address] ?[place],  [place] [address] ?Chief Complaint  ?Patient presents with  ? Benign Prostatic Hypertrophy  ? ? ? ?HPI: ?Zachary Chase is a 76 y.o.male with a personal history of right hydrocele, BPH with urinary frequency, urgency, incomplete bladder emptying, and UTIs, who presents today for a 6 week follow-up with PVR and IPSS.  ? ?Scrotal ultrasound on 11/18/2021 visualized a stable 2.1 cm right epididymal cyst, interval large right hydrocele, and possible mild left variocele.  ? ?He had urinary symptoms managed by his primary care including being started on Flomax and more recently oxybutynin.  Oxybutynin was stopped in light of recent incomplete bladder emptying.  He reports that after about 2 weeks of not taking it, he could not stand his urinary frequency and started taking this medication again, 5 mg twice daily. ? ?He is managed on Flomax 0.8 mg.  He stopped taking finasteride but resumed it since the last visit. ? ?He reports urinary frequency he has to urinate every 2-3 hours. He does not finish his stream when he first urinates sometimes; he will have to return to the bathroom to void the rest about 10-15 minutes after initial void. He has had improvement in his testicular tenderness.  ? ? IPSS   ? ? [place] Name 01/27/22 1000  ?  ?  ?  ? International Prostate Symptom Score  ? How often have you had the sensation of not emptying your bladder? Not at All    ? How often have you had to urinate less than every two hours? More than half the time    ? How often have you found you stopped and started again several times when you urinated? Almost always    ? How often have you found it difficult to postpone urination? More than half the time    ? How often have you had a weak urinary stream? Not at All    ? How often have you had to strain to start urination? Not at All    ? How many  times did you typically get up at night to urinate? 3 Times    ? Total IPSS Score 16    ?  ? Quality of Life due to urinary symptoms  ? If you were to spend the rest of your life with your urinary condition just the way it is now how would you feel about that? Mostly Satisfied    ? ?  ?  ? ?  ? ? ?Score:  ?1-7 Mild ?8-19 Moderate ?20-35 Severe ? ? ?PMH: ?Past Medical History:  ?Diagnosis Date  ? Anemia   ? Arthritis   ? Asthma   ? Back pain   ? BPH (benign prostatic hyperplasia)   ? Dermatophytosis of nail   ? Enthesopathy of hip region   ? Gastric ulcer   ? GERD (gastroesophageal reflux disease)   ? H/O multiple allergies   ? History of colonic polyps   ? Hx of onychia and paronychia   ? Hypersomnia   ? Hypertension   ? Lower extremity edema   ? Pneumonia   ? Prostatic hypertrophy   ? Psoriasis   ? Pure hypercholesterolemia   ? Sleep apnea   ? Testicular hypofunction   ? Tremor   ? of the head  ? Wheezing   ? ? ?Surgical History: ?Past Surgical History:  ?Procedure  Laterality Date  ? CATARACT EXTRACTION W/PHACO Right 09/01/2016  ? Procedure: CATARACT EXTRACTION PHACO AND INTRAOCULAR LENS PLACEMENT ([facility]);  Surgeon: Birder Robson, MD;  Location: [facility] ORS;  Service: Ophthalmology;  Laterality: Right;  Lot# [id number] ?[place]: 00:34.0 ?AP%:21.8 ?CDE: 7.39 ?  ? CATARACT EXTRACTION W/PHACO Left 07/13/2019  ? Procedure: CATARACT EXTRACTION PHACO AND INTRAOCULAR LENS PLACEMENT ([place]) LEFT;  Surgeon: Birder Robson, MD;  Location: [facility] ORS;  Service: Ophthalmology;  Laterality: Left;  [place] 00:37 ?CDE 6.43 ?Fluid pack lot # [id number]  ? CHOLECYSTECTOMY N/A 05/07/2019  ? Procedure: LAPAROSCOPIC CHOLECYSTECTOMY;  Surgeon: Jules Husbands, MD;  Location: [facility] ORS;  Service: General;  Laterality: N/A;  ? COLONOSCOPY WITH PROPOFOL N/A 11/30/2018  ? Procedure: COLONOSCOPY WITH PROPOFOL;  Surgeon: Manya Silvas, MD;  Location: [facility] ENDOSCOPY;  Service: Endoscopy;  Laterality: N/A;  ? ESOPHAGOGASTRODUODENOSCOPY (EGD) WITH PROPOFOL N/A  11/30/2018  ? Procedure: ESOPHAGOGASTRODUODENOSCOPY (EGD) WITH PROPOFOL;  Surgeon: Manya Silvas, MD;  Location: [facility] ENDOSCOPY;  Service: Endoscopy;  Laterality: N/A;  ? sleep apnea surgery    ? TONSILLECTOMY    ? ? ?Home Medications:  ?Allergies as of 01/27/2022   ? ?   Reactions  ? Tetracyclines & Related Nausea And Vomiting  ? ?  ? ?  ?Medication List  ?  ? ?  ? Accurate as of January 27, 2022 11:05 AM. If you have any questions, ask your nurse or doctor.  ?  ?  ? ?  ? ?albuterol 108 (90 Base) MCG/ACT inhaler ?Commonly known as: VENTOLIN HFA ?Inhale 1 puff into the lungs every 4 (four) hours as needed for wheezing or shortness of breath. ?  ?augmented betamethasone dipropionate 0.05 % ointment ?Commonly known as: DIPROLENE-AF ?Apply 1 application topically 2 (two) times daily. ?  ?famotidine 10 MG tablet ?Commonly known as: PEPCID ?Take 10 mg by mouth as needed for heartburn or indigestion. ?  ?ferrous sulfate 325 (65 FE) MG tablet ?Take 325 mg by mouth daily with breakfast. ?  ?fexofenadine 180 MG tablet ?Commonly known as: ALLEGRA ?Take 180 mg by mouth daily. ?  ?finasteride 5 MG tablet ?Commonly known as: PROSCAR ?Take 5 mg by mouth daily. ?  ?fluticasone 50 MCG/ACT nasal spray ?Commonly known as: FLONASE ?Place 2 sprays into both nostrils daily as needed for allergies or rhinitis. ?  ?glucosamine-chondroitin 500-400 MG tablet ?Take 1 tablet by mouth daily. ?  ?hydrocortisone cream 1 % ?Apply 1 application topically 2 (two) times daily. ?  ?lisinopril-hydrochlorothiazide 20-12.5 MG tablet ?Commonly known as: ZESTORETIC ?Take 1 tablet by mouth daily. ?  ?Melatonin 10 MG Tabs ?Take 1 tablet by mouth at bedtime. ?  ?tamsulosin 0.4 MG Caps capsule ?Commonly known as: FLOMAX ?Take 0.4 mg by mouth 2 (two) times daily. ?  ?Wixela Inhub 250-50 MCG/ACT Aepb ?Generic drug: fluticasone-salmeterol ?Inhale 1 puff into the lungs 2 (two) times a day. ?  ? ?  ? ? ?Allergies:  ?Allergies  ?Allergen Reactions  ? Tetracyclines &  Related Nausea And Vomiting  ? ? ?Family History: ?No family history on file. ? ?Social History:  reports that he has quit smoking. He has never used smokeless tobacco. He reports current alcohol use. He reports that he does not currently use drugs. ? ? ?Physical Exam: ?BP 125/75   Pulse 66   Ht '5\' 3"'$  (1.6 m)   Wt 220 lb (99.8 kg)   BMI 38.97 kg/m?   ?Constitutional:  Alert and oriented, No acute distress. ?HEENT: [place] AT, moist mucus  membranes.  Trachea midline, no masses. ?Cardiovascular: No clubbing, cyanosis, or edema. ?Respiratory: Normal respiratory effort, no increased work of breathing. ?GU: Right testicle /scrotal sac has decreased in size now soft  ?Skin: No rashes, bruises or suspicious lesions. ?Neurologic: Grossly intact, no focal deficits, moving all 4 extremities. ?Psychiatric: Normal mood and affect. ? ?Laboratory Data: ? ?Lab Results  ?Component Value Date  ? CREATININE 1.33 (H) 05/10/2019  ? ?Lab Results  ?Component Value Date  ? HGBA1C 6.1 12/15/2013  ? ? ?Pertinent Imaging: ?PVR 0  ? ?Assessment & Plan:   ? ?Right hydrocele  ?- Resolved; likely inflammatory related and resolved with treatment of infection ?- Exam is reassuring today ? ?2. BPH with urinary frequency/ urgency /incomplete bladder emptying ?- He is emptying adequately  ?- Continue on Flomax and finasteride ?- He self resumed oxybutynin in light of his improvement with emptying he is probably okay to resume this medication and that he is emptying better today ?- We discussed staying on medications versus outlet procedures. We discussed procedures in details today. He is interested in a less invasive procedure such as UroLift discussed with him that if he would like to proceed with a procedure he would need to undergo a cysto/TRUS. He is interested in maximum therapy.  ? ?Follow-up in 6 months with IPSS/PVR ? ?I,Kailey Littlejohn,acting as a Education administrator for Hollice Espy, MD.,have documented all relevant documentation on the behalf of  Hollice Espy, MD,as directed by  Hollice Espy, MD while in the presence of Hollice Espy, MD. ? ?I have reviewed the above documentation for accuracy and completeness, and I agree with the above.  ? ?Genia Del

## 2022-01-27 ENCOUNTER — Ambulatory Visit (INDEPENDENT_AMBULATORY_CARE_PROVIDER_SITE_OTHER): Payer: Medicare Other | Admitting: Urology

## 2022-01-27 VITALS — BP 125/75 | HR 66 | Ht 63.0 in | Wt 220.0 lb

## 2022-01-27 DIAGNOSIS — N401 Enlarged prostate with lower urinary tract symptoms: Secondary | ICD-10-CM | POA: Diagnosis not present

## 2022-01-27 DIAGNOSIS — R3914 Feeling of incomplete bladder emptying: Secondary | ICD-10-CM

## 2022-07-28 NOTE — Progress Notes (Unsigned)
07/29/2022 6:44 PM   Zachary Chase 10-21-1946 [id number]  Referring provider: Juluis Pitch, MD [phone] [place],  [place] [address]  Urological history: 1. Epididymal cyst -scrotal US (10/2021) - Stable 2.1 cm right epididymal cyst  2. Hydrocele -scrotal US (10/2021) - Interval large right hydrocele  3. Varicocele -scrotal US (10/2021) - Possible mild left varicocele  4. BPH w/ LU TS -PSA (07/2021) 0.96 -I PSS *** -PVR *** -tamsulosin 0.4 mg two tablets daily and finasteride 5 mg daily ***  5. Urinary frequency -contributing factors of age, BPH, incomplete bladder emptying, HTN, sleep apnea, lumber DDD, obesity and former smoker -PVR *** -Myrbetriq 25 mg daily and oxybutynin 5 mg twice daily ***    No chief complaint on file.   HPI: Zachary Chase is a 76 y.o. male who presents today for a 6 month follow up.    Score:  1-7 Mild 8-19 Moderate 20-35 Severe    PMH: Past Medical History:  Diagnosis Date   Anemia    Arthritis    Asthma    Back pain    BPH (benign prostatic hyperplasia)    Dermatophytosis of nail    Enthesopathy of hip region    Gastric ulcer    GERD (gastroesophageal reflux disease)    H/O multiple allergies    History of colonic polyps    Hx of onychia and paronychia    Hypersomnia    Hypertension    Lower extremity edema    Pneumonia    Prostatic hypertrophy    Psoriasis    Pure hypercholesterolemia    Sleep apnea    Testicular hypofunction    Tremor    of the head   Wheezing     Surgical History: Past Surgical History:  Procedure Laterality Date   CATARACT EXTRACTION W/PHACO Right 09/01/2016   Procedure: CATARACT EXTRACTION PHACO AND INTRAOCULAR LENS PLACEMENT ([place]);  Surgeon: Birder Robson, MD;  Location: [facility] ORS;  Service: Ophthalmology;  Laterality: Right;  Lot# [id number] H [place]: 00:34.0 AP%:21.8 CDE: 7.39    CATARACT EXTRACTION W/PHACO Left 07/13/2019   Procedure: CATARACT EXTRACTION PHACO AND  INTRAOCULAR LENS PLACEMENT ([place]) LEFT;  Surgeon: Birder Robson, MD;  Location: [facility] ORS;  Service: Ophthalmology;  Laterality: Left;  [place] 00:37 CDE 6.43 Fluid pack lot # [id number]   CHOLECYSTECTOMY N/A 05/07/2019   Procedure: LAPAROSCOPIC CHOLECYSTECTOMY;  Surgeon: Jules Husbands, MD;  Location: [facility] ORS;  Service: General;  Laterality: N/A;   COLONOSCOPY WITH PROPOFOL N/A 11/30/2018   Procedure: COLONOSCOPY WITH PROPOFOL;  Surgeon: Manya Silvas, MD;  Location: [facility] ENDOSCOPY;  Service: Endoscopy;  Laterality: N/A;   ESOPHAGOGASTRODUODENOSCOPY (EGD) WITH PROPOFOL N/A 11/30/2018   Procedure: ESOPHAGOGASTRODUODENOSCOPY (EGD) WITH PROPOFOL;  Surgeon: Manya Silvas, MD;  Location[facility]COPY;  Service: Endoscopy;  Laterality: N/A;   sleep apnea surgery     TONSILLECTOMY      Home Medications:  Allergies as of 07/29/2022       Reactions   Tetracyclines & Related Nausea And Vomiting        Medication List        Accurate as of July 28, 2022  6:44 PM. If you have any questions, ask your nurse or doctor.          albuterol 108 (90 Base) MCG/ACT inhaler Commonly known as: VENTOLIN HFA Inhale 1 puff into the lungs every 4 (four) hours as needed for wheezing or shortness of breath.   augmented betamethasone dipropionate 0.05 %  ointment Commonly known as: DIPROLENE-AF Apply 1 application topically 2 (two) times daily.   famotidine 10 MG tablet Commonly known as: PEPCID Take 10 mg by mouth as needed for heartburn or indigestion.   ferrous sulfate 325 (65 FE) MG tablet Take 325 mg by mouth daily with breakfast.   fexofenadine 180 MG tablet Commonly known as: ALLEGRA Take 180 mg by mouth daily.   finasteride 5 MG tablet Commonly known as: PROSCAR Take 5 mg by mouth daily.   fluticasone 50 MCG/ACT nasal spray Commonly known as: FLONASE Place 2 sprays into both nostrils daily as needed for allergies or rhinitis.   glucosamine-chondroitin 500-400 MG tablet Take 1  tablet by mouth daily.   hydrocortisone cream 1 % Apply 1 application topically 2 (two) times daily.   lisinopril-hydrochlorothiazide 20-12.5 MG tablet Commonly known as: ZESTORETIC Take 1 tablet by mouth daily.   Melatonin 10 MG Tabs Take 1 tablet by mouth at bedtime.   oxybutynin 5 MG tablet Commonly known as: DITROPAN Take 5 mg by mouth 2 (two) times daily.   tamsulosin 0.4 MG Caps capsule Commonly known as: FLOMAX Take 0.4 mg by mouth 2 (two) times daily.   Wixela Inhub 250-50 MCG/ACT Aepb Generic drug: fluticasone-salmeterol Inhale 1 puff into the lungs 2 (two) times a day.        Allergies:  Allergies  Allergen Reactions   Tetracyclines & Related Nausea And Vomiting    Family History: No family history on file.  Social History:  reports that he has quit smoking. He has never used smokeless tobacco. He reports current alcohol use. He reports that he does not currently use drugs.  ROS: Pertinent ROS in HPI  Physical Exam: There were no vitals taken for this visit.  Constitutional:  Well nourished. Alert and oriented, No acute distress. HEENT: [place] AT, moist mucus membranes.  Trachea midline, no masses. Cardiovascular: No clubbing, cyanosis, or edema. Respiratory: Normal respiratory effort, no increased work of breathing. GI: Abdomen is soft, non tender, non distended, no abdominal masses. Liver and spleen not palpable.  No hernias appreciated.  Stool sample for occult testing is not indicated.   GU: No CVA tenderness.  No bladder fullness or masses.  Patient with circumcised/uncircumcised phallus. ***Foreskin easily retracted***  Urethral meatus is patent.  No penile discharge. No penile lesions or rashes. Scrotum without lesions, cysts, rashes and/or edema.  Testicles are located scrotally bilaterally. No masses are appreciated in the testicles. Left and right epididymis are normal. Rectal: Patient with  normal sphincter tone. Anus and perineum without scarring or  rashes. No rectal masses are appreciated. Prostate is approximately *** grams, *** nodules are appreciated. Seminal vesicles are normal. Skin: No rashes, bruises or suspicious lesions. Lymph: No cervical or inguinal adenopathy. Neurologic: Grossly intact, no focal deficits, moving all 4 extremities. Psychiatric: Normal mood and affect.  Laboratory Data: WBC (White Blood Cell Count) 4.1 - 10.2 10^3/uL 6.6   RBC (Red Blood Cell Count) 4.69 - 6.13 10^6/uL 4.10 Low    Hemoglobin 14.1 - 18.1 gm/dL 12.8 Low    Hematocrit 40.0 - 52.0 % 38.3 Low    MCV (Mean Corpuscular Volume) 80.0 - 100.0 fl 93.4   MCH (Mean Corpuscular Hemoglobin) 27.0 - 31.2 pg 31.2   MCHC (Mean Corpuscular Hemoglobin Concentration) 32.0 - 36.0 gm/dL 33.4   Platelet Count 150 - 450 10^3/uL 187   RDW-CV (Red Cell Distribution Width) 11.6 - 14.8 % 13.2   MPV (Mean Platelet Volume) 9.4 - 12.4 fl  11.0   Neutrophils 1.50 - 7.80 10^3/uL 4.00   Lymphocytes 1.00 - 3.60 10^3/uL 1.90   Mixed Count 0.10 - 0.90 10^3/uL 0.70   Neutrophil % 32.0 - 70.0 % 60.3   Lymphocyte % 10.0 - 50.0 % 28.4   Mixed % 3.0 - 14.4 % 11.3   Resulting Agency  [facility]   Specimen Collected: 03/02/22 08:51   Performed by: [facility] Last Resulted: 03/02/22 15:47  Received From: [facility]  Result Received: 07/28/22 18:44   Glucose 70 - 110 mg/dL 257 High    Sodium 988 - 145 mmol/L 139   Potassium 3.6 - 5.1 mmol/L 4.5   Chloride 97 - 109 mmol/L 107   Carbon Dioxide (CO2) 22.0 - 32.0 mmol/L 25.2   Urea Nitrogen (BUN) 7 - 25 mg/dL 31 High    Creatinine 0.7 - 1.3 mg/dL 1.3   Glomerular Filtration Rate (eGFR) >60 mL/min/1.73sq m 54 Low    Calcium 8.7 - 10.3 mg/dL 8.9   AST  8 - 39 U/L 13   ALT  6 - 57 U/L 18   Alk Phos (alkaline Phosphatase) 34 - 104 U/L 76   Albumin 3.5 - 4.8 g/dL 4.2   Bilirubin, Total 0.3 - 1.2 mg/dL 0.6   Protein, Total 6.1 - 7.9 g/dL 6.8   A/G Ratio 1.0 - 5.0 gm/dL 1.6    Resulting Agency  [facility]   Specimen Collected: 03/02/22 08:51   Performed by: [facility] Last Resulted: 03/02/22 15:42  Received From: [facility]  Result Received: 07/28/22 18:44   Cholesterol, Total 100 - 200 mg/dL 767   Triglyceride 35 - 199 mg/dL 98   HDL (High Density Lipoprotein) Cholesterol 29.0 - 71.0 mg/dL 81.1   LDL Calculated 0 - 130 mg/dL 837   VLDL Cholesterol mg/dL 20   Cholesterol/HDL Ratio  4.2   Resulting Agency  [facility]   Specimen Collected: 03/02/22 08:51   Performed by: [facility] Last Resulted: 03/02/22 15:42  Received From: [facility]  Result Received: 07/28/22 18:44   Hemoglobin A1C 4.2 - 5.6 % 6.0 High    Average Blood Glucose (Calc) mg/dL 400   Resulting Agency  [facility]  Narrative Performed by [facility] Normal Range:    4.2 - 5.6%  Increased Risk:  5.7 - 6.4%  Diabetes:        >= 6.5%  Glycemic Control for adults with diabetes:  <7%    Specimen Collected: 03/02/22 08:51   Performed by: [facility] Last Resulted: 03/02/22 13:09  Received From: [facility]  Result Received: 07/28/22 18:44  I have reviewed the labs.   Pertinent Imaging: ***   Assessment & Plan:  ***  1. BPH with LUTS -PSA stable *** -DRE benign *** -UA benign *** -PVR < 300 cc *** -symptoms - *** -most bothersome symptoms are *** -continue conservative management, avoiding bladder irritants and timed voiding's -Continue tamsulosin 0.4 mg daily and finasteride 5 mg daily *** -Cannot tolerate medication or medication failure, schedule cystoscopy ***  2. OAB ***       No follow-ups on file.  These notes generated with voice recognition software. I apologize for typographical errors.  Cloretta Ned  [facility] [address]  [address] [place], [place]  [address] [phone]

## 2022-07-29 ENCOUNTER — Ambulatory Visit (INDEPENDENT_AMBULATORY_CARE_PROVIDER_SITE_OTHER): Payer: Medicare Other | Admitting: Urology

## 2022-07-29 ENCOUNTER — Encounter: Payer: Self-pay | Admitting: Urology

## 2022-07-29 VITALS — BP 123/67 | HR 69 | Ht 63.0 in | Wt 205.0 lb

## 2022-07-29 DIAGNOSIS — N401 Enlarged prostate with lower urinary tract symptoms: Secondary | ICD-10-CM | POA: Diagnosis not present

## 2022-07-29 DIAGNOSIS — R351 Nocturia: Secondary | ICD-10-CM

## 2022-07-29 DIAGNOSIS — R339 Retention of urine, unspecified: Secondary | ICD-10-CM

## 2022-07-29 DIAGNOSIS — N3281 Overactive bladder: Secondary | ICD-10-CM | POA: Diagnosis not present

## 2022-07-29 LAB — BLADDER SCAN AMB NON-IMAGING: Scan Result: 615

## 2022-07-30 ENCOUNTER — Telehealth: Payer: Self-pay

## 2022-07-30 LAB — BASIC METABOLIC PANEL
BUN/Creatinine Ratio: 19 (ref 10–24)
BUN: 22 mg/dL (ref 8–27)
CO2: 22 mmol/L (ref 20–29)
Calcium: 9.3 mg/dL (ref 8.6–10.2)
Chloride: 102 mmol/L (ref 96–106)
Creatinine, Ser: 1.17 mg/dL (ref 0.76–1.27)
Glucose: 101 mg/dL — ABNORMAL HIGH (ref 70–99)
Potassium: 4.5 mmol/L (ref 3.5–5.2)
Sodium: 140 mmol/L (ref 134–144)
eGFR: 64 mL/min/{1.73_m2} (ref >59)

## 2022-07-30 NOTE — Telephone Encounter (Signed)
-----   Message from Nori Riis, PA-C sent at 07/30/2022  8:02 AM EDT ----- Please let Mr. Piacentini know that his serum creatinine (the blood test for his kidneys) is normal.  I still recommend that he stop his oxybutynin and keep his appointment in December with Dr. Erlene Quan.

## 2022-07-30 NOTE — Telephone Encounter (Signed)
Notified pt as advised, pt verbalized understanding.  

## 2022-10-06 ENCOUNTER — Ambulatory Visit (INDEPENDENT_AMBULATORY_CARE_PROVIDER_SITE_OTHER): Payer: Medicare Other | Admitting: Urology

## 2022-10-06 DIAGNOSIS — R3914 Feeling of incomplete bladder emptying: Secondary | ICD-10-CM

## 2022-10-06 DIAGNOSIS — N3281 Overactive bladder: Secondary | ICD-10-CM

## 2022-10-06 DIAGNOSIS — N401 Enlarged prostate with lower urinary tract symptoms: Secondary | ICD-10-CM

## 2022-10-06 LAB — URINALYSIS, COMPLETE
Bilirubin, UA: NEGATIVE
Glucose, UA: NEGATIVE
Ketones, UA: NEGATIVE
Leukocytes,UA: NEGATIVE
Nitrite, UA: NEGATIVE
Protein,UA: NEGATIVE
RBC, UA: NEGATIVE
Specific Gravity, UA: 1.015 (ref 1.005–1.030)
Urobilinogen, Ur: 0.2 mg/dL (ref 0.2–1.0)
pH, UA: 5.5 (ref 5.0–7.5)

## 2022-10-06 LAB — MICROSCOPIC EXAMINATION: Bacteria, UA: NONE SEEN

## 2022-10-06 NOTE — Progress Notes (Signed)
Patient rescheduled to Thursday  Hollice Espy, MD

## 2022-10-08 ENCOUNTER — Ambulatory Visit (INDEPENDENT_AMBULATORY_CARE_PROVIDER_SITE_OTHER): Payer: Medicare Other | Admitting: Urology

## 2022-10-08 VITALS — BP 125/76 | HR 60 | Ht 63.0 in

## 2022-10-08 DIAGNOSIS — N401 Enlarged prostate with lower urinary tract symptoms: Secondary | ICD-10-CM

## 2022-10-08 DIAGNOSIS — Z01818 Encounter for other preprocedural examination: Secondary | ICD-10-CM

## 2022-10-08 DIAGNOSIS — R339 Retention of urine, unspecified: Secondary | ICD-10-CM

## 2022-10-08 DIAGNOSIS — N3281 Overactive bladder: Secondary | ICD-10-CM

## 2022-10-08 LAB — URINALYSIS, COMPLETE
Bilirubin, UA: NEGATIVE
Glucose, UA: NEGATIVE
Ketones, UA: NEGATIVE
Leukocytes,UA: NEGATIVE
Nitrite, UA: NEGATIVE
Protein,UA: NEGATIVE
RBC, UA: NEGATIVE
Specific Gravity, UA: 1.025 (ref 1.005–1.030)
Urobilinogen, Ur: 0.2 mg/dL (ref 0.2–1.0)
pH, UA: 5 (ref 5.0–7.5)

## 2022-10-08 LAB — MICROSCOPIC EXAMINATION

## 2022-10-08 NOTE — Progress Notes (Signed)
10/08/22  Chief Complaint  Patient presents with   Trus/Cysto     HPI: 76 y.o. year-old male with chronic urinary retention who presents today for further evaluation    Please see previous notes for details.     Blood pressure 125/76, pulse 60, height '5\' 3"'$  (1.6 m). NED. A&Ox3.   No respiratory distress   Abd soft, NT, ND Normal phallus with bilateral descended testicles    Cystoscopy Procedure Note  Patient identification was confirmed, informed consent was obtained, and patient was prepped using Betadine solution.  Lidocaine jelly was administered per urethral meatus.    Preoperative abx where received prior to procedure.     Pre-Procedure: - Inspection reveals a normal caliber ureteral meatus.  Procedure: The flexible cystoscope was introduced without difficulty - No urethral strictures/lesions are present. - Enlarged prostate trilobar cap - Elevated bladder neck - Bilateral ureteral orifices identified - Bladder mucosa  reveals no ulcers, tumors, or lesions - No bladder stones - Mild trabeculation  Retroflexion shows small well circumcised median lobe   Post-Procedure: - Patient tolerated the procedure well   Prostate transrectal ultrasound sizing   Informed consent was obtained after discussing risks/benefits of the procedure.  A time out was performed to ensure correct patient identity.   Pre-Procedure: -Transrectal probe was placed without difficulty -Transrectal Ultrasound performed revealing a 64.6 gm prostate measuring 4.25 x 5.29 x 5.50 cm (length) -Median lobe noted    Assessment/ Plan:  1. Benign prostatic hyperplasia with incomplete bladder emptying Strongly recommend TURP vs. HoLEP to address outlet and chronic retention, failed maximal medical therapy  He is most interested in TURP, risk of bleeding, infection, damage to surrounding structures, need for foley, bladder neck contracture, retrograde ejaculation, failure to resolve urinary  symptoms all discussed.  All questions answered.  Literature given today. - Urinalysis, Complete - CULTURE, URINE COMPREHENSIVE  2. OAB (overactive bladder) Continue to hold oxybutynin due to chronic retention  - Urinalysis, Complete - CULTURE, URINE COMPREHENSIVE    Hollice Espy, MD

## 2022-10-08 NOTE — Patient Instructions (Signed)
Transurethral Resection of the Prostate ?Transurethral resection of the prostate (TURP) is the removal, or resection, of part of the prostate tissue. This procedure is done to treat an enlarged prostate gland (benign prostatic hyperplasia). ?The goal of TURP is to remove enough prostate tissue to allow for a normal flow of urine. The procedure will allow you to empty your bladder more completely when you urinate so that you can urinate less often. ?In a transurethral resection, a thin telescope with a light, a camera, and an electric cutting edge (resectoscope) is passed through the urethra and into the prostate. The opening of the urethra is at the end of the penis. ?Tell a health care provider about: ?Any allergies you have. ?All medicines you are taking, including vitamins, herbs, eye drops, creams, and over-the-counter medicines. ?Any problems you or family members have had with anesthetic medicines. ?Any bleeding problems you have. ?Any surgeries you have had. ?Any medical conditions you have. ?Any prostate infections you have had. ?What are the risks? ?Generally, this is a safe procedure. However, problems may occur, including: ?Infection. ?Bleeding. ?Allergic reactions to medicines. ?Blood in the urine (hematuria). ?Damage to nearby structures or organs. ?Other problems may occur, but they are rare. They include: ?Dry ejaculation, or having no semen come out during orgasm. ?Erectile dysfunction, or being unable to have or keep an erection. ?Scarring that leads to narrowing of the urethra. This narrowing may block the flow of urine. ?Inability to control when you urinate (incontinence). ?Deep vein thrombosis. This is a blood clot that can develop in your leg. ?TURP syndrome. This can happen when you lose too much sodium during or after the procedure. Some signs and symptoms of this condition include: ?Weakness. ?Headaches. ?Nausea or vomiting. ?Muscle cramping. ?What happens before the procedure? ?When to stop  eating and drinking ?Follow instructions from your health care provider about what you may eat and drink before your procedure. These may include: ?8 hours before your procedure ?Stop eating most foods. Do not eat meat, fried foods, or fatty foods. ?Eat only light foods, such as toast or crackers. ?All liquids are okay except energy drinks and alcohol. ?6 hours before your procedure ?Stop eating. ?Drink only clear liquids, such as water, clear fruit juice, black coffee, plain tea, and sports drinks. ?Do not drink energy drinks or alcohol. ?2 hours before your procedure ?Stop drinking all liquids. ?You may be allowed to take medicines with small sips of water. ?If you do not follow your health care provider's instructions, your procedure may be delayed or canceled. ?Medicines ?Ask your health care provider about: ?Changing or stopping your regular medicines. This is especially important if you are taking diabetes medicines or blood thinners. ?Taking medicines such as aspirin and ibuprofen. These medicines can thin your blood. Do not take these medicines unless your health care provider tells you to take them. ?Taking over-the-counter medicines, vitamins, herbs, and supplements. ?Surgery safety ?Ask your health care provider what steps will be taken to help prevent infection. These steps may include: ?Removing hair at the surgery site. ?Washing skin with a germ-killing soap. ?Taking antibiotic medicine. ?General instructions ?Do not use any products that contain nicotine or tobacco for at least 4 weeks before the procedure. These products include cigarettes, chewing tobacco, and vaping devices, such as e-cigarettes. If you need help quitting, ask your health care provider. ?If you will be going home right after the procedure, plan to have a responsible adult: ?Take you home from the hospital or clinic. You   will not be allowed to drive. ?Care for you for the time you are told. ?What happens during the procedure? ? ?An  IV will be inserted into one of your veins. ?You will be given one or more of the following: ?A medicine to help you relax (sedative). ?A medicine to make you fall asleep (general anesthetic). ?A medicine that is injected into your spine to numb the area below and slightly above the injection site (spinal anesthetic). ?Your legs will be placed in foot rests (stirrups) so that your legs are apart and your knees are bent. ?The resectoscope will be passed through your urethra to your prostate. ?Parts of your prostate will be resected using the cutting edge of the resectoscope. ?Fluid will be passed to rinse out the cut tissues (irrigation). ?The resectoscope will be removed. ?A small, thin tube (catheter) will be passed through your urethra and into your bladder. The catheter will drain urine into a bag outside of your body. ?The procedure may vary among health care providers and hospitals. ?What happens after the procedure? ?Your blood pressure, heart rate, breathing rate, and blood oxygen level will be monitored until you leave the hospital or clinic. ?You will be given fluids through the IV. The IV will be removed when you start eating and drinking normally. ?You may have some pain. Pain medicine will be available to help you. ?You will have a catheter draining your urine. ?You may have blood in your urine. Your catheter may be kept in until your urine is clear. ?Your urinary drainage will be monitored. If necessary, your bladder may be rinsed out (irrigated) through your catheter. ?You will be encouraged to walk around as soon as possible. ?You may have to wear compression stockings. These stockings help to prevent blood clots and reduce swelling in your legs. ?If you were given a sedative during the procedure, it can affect you for several hours. Do not drive or operate machinery until your health care provider says that it is safe. ?Summary ?Transurethral resection of the prostate (TURP) is the removal  (resection) of part of the prostate tissue. ?The goal of this procedure is to remove enough prostate tissue to allow for a normal flow of urine. ?Follow instructions from your health care provider about taking medicines and about eating and drinking before the procedure. ?This information is not intended to replace advice given to you by your health care provider. Make sure you discuss any questions you have with your health care provider. ?Document Revised: 07/08/2021 Document Reviewed: 07/08/2021 ?Elsevier Patient Education ? 2023 Elsevier Inc[employer] ? ?

## 2022-10-10 ENCOUNTER — Other Ambulatory Visit: Payer: Self-pay | Admitting: Urology

## 2022-10-10 DIAGNOSIS — N138 Other obstructive and reflux uropathy: Secondary | ICD-10-CM

## 2022-10-10 NOTE — Progress Notes (Signed)
Surgical Physician Order Form [facility] [place]  * Scheduling expectation : Next Available  *Length of Case:   *Clearance needed: no  *Anticoagulation Instructions: Hold all anticoagulants  *Aspirin Instructions: Hold Aspirin  *Post-op visit Date/Instructions:   2 days voiding trial  *Diagnosis: BPH w/urinary obstruction  *Procedure:   TURP   Additional orders: N/A  -Admit type: OUTpatient  -Anesthesia: General  -VTE Prophylaxis Standing Order SCD's       Other:   -Standing Lab Orders Per Anesthesia    Lab other: None  -Standing Test orders EKG/Chest x-ray per Anesthesia       Test other:   - Medications:  Ancef 2gm IV  -Other orders:  N/A

## 2022-10-11 LAB — CULTURE, URINE COMPREHENSIVE

## 2022-10-15 ENCOUNTER — Telehealth: Payer: Self-pay

## 2022-10-15 NOTE — Progress Notes (Signed)
   [facility]  Surgery Date: Date: 11/16/2022  Surgeon: Dr. Hollice Espy, MD  Inpt ( No  )   Outpt (Yes)   Obs ( No  )   Diagnosis: N40.1, N13.8 Benign Prostatic Hyperplasia with Urinary Obstruction  -CPT: 63817  Surgery: Transurethral Resection of the Prostate  Stop Anticoagulations: Yes and also hold ASA  Cardiac/Medical/Pulmonary Clearance needed: no  *Orders entered into EPIC  Date: 10/15/22   *Case booked in [place]  Date: 10/13/2022  *Notified pt of Surgery: Date: 10/13/2022  PRE-OP UA & CX: no  *Placed into Prior Authorization Work [place] Date: 10/15/22  Assistant/laser/rep:No

## 2022-10-15 NOTE — Telephone Encounter (Signed)
I spoke with Zachary Chase. We have discussed possible surgery dates and Monday January 22nd, 2024 was agreed upon by all parties. Patient given information about surgery date, what to expect pre-operatively and post operatively.  We discussed that a Pre-Admission Testing office will be calling to set up the pre-op visit that will take place prior to surgery, and that these appointments are typically done over the phone with a Pre-Admissions RN. Informed patient that our office will communicate any additional care to be provided after surgery. Patients questions or concerns were discussed during our call. Advised to call our office should there be any additional information, questions or concerns that arise. Patient verbalized understanding.

## 2022-11-05 ENCOUNTER — Encounter
Admission: RE | Admit: 2022-11-05 | Discharge: 2022-11-05 | Disposition: A | Discharge status: Home / Self Care | Payer: Medicare Other | Source: Ambulatory Visit | Attending: Urology | Admitting: Urology

## 2022-11-05 ENCOUNTER — Other Ambulatory Visit: Payer: Self-pay

## 2022-11-05 DIAGNOSIS — Z01812 Encounter for preprocedural laboratory examination: Secondary | ICD-10-CM

## 2022-11-05 HISTORY — DX: Malignant (primary) neoplasm, unspecified: C80.1

## 2022-11-05 HISTORY — DX: Dyspnea, unspecified: R06.00

## 2022-11-05 NOTE — Patient Instructions (Addendum)
Your procedure is scheduled on: 11/16/22 - Monday Report to the Registration Desk on the 1st floor of the [facility]. To find out your arrival time, please call [phone] between 1PM - 3PM on: 11/13/22 - Friday If your arrival time is 6:00 am, do not arrive prior to that time as the [facility] entrance doors do not open until 6:00 am.  REMEMBER: Instructions that are not followed completely may result in serious medical risk, up to and including death; or upon the discretion of your surgeon and anesthesiologist your surgery may need to be rescheduled.  Do not eat food or drink any liquids after midnight the night before surgery.  No gum chewing, lozengers or hard candies.  TAKE THESE MEDICATIONS THE MORNING OF SURGERY WITH A SIP OF WATER:  -pantoprazole (PROTONIX)  - finasteride (PROSCAR)  - tamsulosin (FLOMAX)  - WIXELA INH    One week prior to surgery: Stop Anti-inflammatories (NSAIDS) such as Advil, Aleve, Ibuprofen, Motrin, Naproxen, Naprosyn and Aspirin based products such as Excedrin, Goodys Powder, BC Powder.  Stop ANY OVER THE COUNTER supplements until after surgery.  You may however, continue to take Tylenol if needed for pain up until the day of surgery.  No Alcohol for 24 hours before or after surgery.  No Smoking including e-cigarettes for 24 hours prior to surgery.  No chewable tobacco products for at least 6 hours prior to surgery.  No nicotine patches on the day of surgery.  Do not use any "recreational" drugs for at least a week prior to your surgery.  Please be advised that the combination of cocaine and anesthesia may have negative outcomes, up to and including death. If you test positive for cocaine, your surgery will be cancelled.  On the morning of surgery brush your teeth with toothpaste and water, you may rinse your mouth with mouthwash if you wish. Do not swallow any toothpaste or mouthwash.  Do not wear jewelry, make-up, hairpins, clips or nail  polish.  Do not wear lotions, powders, or perfumes.   Do not shave body from the neck down 48 hours prior to surgery just in case you cut yourself which could leave a site for infection.  Also, freshly shaved skin may become irritated if using the CHG soap.  Contact lenses, hearing aids and dentures may not be worn into surgery.  Do not bring valuables to the hospital. [facility] is not responsible for any missing/lost belongings or valuables.   Notify your doctor if there is any change in your medical condition (cold, fever, infection).  Wear comfortable clothing (specific to your surgery type) to the hospital.  After surgery, you can help prevent lung complications by doing breathing exercises.  Take deep breaths and cough every 1-2 hours. Your doctor may order a device called an Incentive Spirometer to help you take deep breaths. When coughing or sneezing, hold a pillow firmly against your incision with both hands. This is called "splinting." Doing this helps protect your incision. It also decreases belly discomfort.  If you are being admitted to the hospital overnight, leave your suitcase in the car. After surgery it may be brought to your room.  If you are being discharged the day of surgery, you will not be allowed to drive home. You will need a responsible adult (18 years or older) to drive you home and stay with you that night.   If you are taking public transportation, you will need to have a responsible adult (18 years or  older) with you. Please confirm with your physician that it is acceptable to use public transportation.   Please call the [facility] Dept. at [phone] if you have any questions about these instructions.  Surgery Visitation Policy:  Patients undergoing a surgery or procedure may have two family members or support persons with them as long as the person is not COVID-19 positive or experiencing its symptoms.   Inpatient Visitation:     Visiting hours are 7 a.m. to 8 p.m. Up to four visitors are allowed at one time in a patient room. The visitors may rotate out with other people during the day. One designated support person (adult) may remain overnight.  Due to an increase in RSV and influenza rates and associated hospitalizations, children ages 49 and under will not be able to visit patients in [facility]. Masks continue to be strongly recommended.

## 2022-11-06 ENCOUNTER — Encounter
Admission: RE | Admit: 2022-11-06 | Discharge: 2022-11-06 | Disposition: A | Discharge status: Home / Self Care | Payer: Medicare Other | Source: Ambulatory Visit | Attending: Urology | Admitting: Urology

## 2022-11-06 DIAGNOSIS — I4510 Unspecified right bundle-branch block: Secondary | ICD-10-CM | POA: Insufficient documentation

## 2022-11-06 DIAGNOSIS — Z01810 Encounter for preprocedural cardiovascular examination: Secondary | ICD-10-CM | POA: Diagnosis not present

## 2022-11-06 DIAGNOSIS — Z01812 Encounter for preprocedural laboratory examination: Secondary | ICD-10-CM

## 2022-11-15 MED ORDER — LACTATED RINGERS IV SOLN: INTRAVENOUS | Status: DC

## 2022-11-15 MED ORDER — CHLORHEXIDINE GLUCONATE 0.12 % MT SOLN: 15.0000 mL | Freq: Once | OROMUCOSAL | Status: AC

## 2022-11-15 MED ORDER — CEFAZOLIN SODIUM-DEXTROSE 2-4 GM/100ML-% IV SOLN
2.0000 g | INTRAVENOUS | Status: AC
  Administered 2022-11-16: 2 g via INTRAVENOUS

## 2022-11-15 MED ORDER — ORAL CARE MOUTH RINSE: 15.0000 mL | Freq: Once | OROMUCOSAL | Status: AC

## 2022-11-16 ENCOUNTER — Encounter
Admission: RE | Disposition: A | Discharge status: Home / Self Care | Payer: Self-pay | Source: Home / Self Care | Attending: Urology

## 2022-11-16 ENCOUNTER — Ambulatory Visit: Payer: Medicare Other | Admitting: Anesthesiology

## 2022-11-16 ENCOUNTER — Other Ambulatory Visit: Payer: Self-pay

## 2022-11-16 ENCOUNTER — Ambulatory Visit
Admission: RE | Admit: 2022-11-16 | Discharge: 2022-11-16 | Disposition: A | Discharge status: Home / Self Care | Payer: Medicare Other | Attending: Urology | Admitting: Urology

## 2022-11-16 DIAGNOSIS — J45909 Unspecified asthma, uncomplicated: Secondary | ICD-10-CM | POA: Insufficient documentation

## 2022-11-16 DIAGNOSIS — R338 Other retention of urine: Secondary | ICD-10-CM | POA: Insufficient documentation

## 2022-11-16 DIAGNOSIS — Z87891 Personal history of nicotine dependence: Secondary | ICD-10-CM | POA: Insufficient documentation

## 2022-11-16 DIAGNOSIS — G4730 Sleep apnea, unspecified: Secondary | ICD-10-CM | POA: Insufficient documentation

## 2022-11-16 DIAGNOSIS — N3281 Overactive bladder: Secondary | ICD-10-CM | POA: Diagnosis not present

## 2022-11-16 DIAGNOSIS — I10 Essential (primary) hypertension: Secondary | ICD-10-CM | POA: Diagnosis not present

## 2022-11-16 DIAGNOSIS — Z9049 Acquired absence of other specified parts of digestive tract: Secondary | ICD-10-CM | POA: Insufficient documentation

## 2022-11-16 DIAGNOSIS — Z8719 Personal history of other diseases of the digestive system: Secondary | ICD-10-CM | POA: Diagnosis not present

## 2022-11-16 DIAGNOSIS — N138 Other obstructive and reflux uropathy: Secondary | ICD-10-CM

## 2022-11-16 DIAGNOSIS — K219 Gastro-esophageal reflux disease without esophagitis: Secondary | ICD-10-CM | POA: Diagnosis not present

## 2022-11-16 DIAGNOSIS — Z8711 Personal history of peptic ulcer disease: Secondary | ICD-10-CM | POA: Diagnosis not present

## 2022-11-16 DIAGNOSIS — E7800 Pure hypercholesterolemia, unspecified: Secondary | ICD-10-CM | POA: Insufficient documentation

## 2022-11-16 DIAGNOSIS — N401 Enlarged prostate with lower urinary tract symptoms: Secondary | ICD-10-CM

## 2022-11-16 HISTORY — PX: TRANSURETHRAL RESECTION OF PROSTATE: SHX73

## 2022-11-16 SURGERY — TURP (TRANSURETHRAL RESECTION OF PROSTATE)
Anesthesia: General

## 2022-11-16 MED ORDER — OXYBUTYNIN CHLORIDE 5 MG PO TABS: 5.0000 mg | ORAL_TABLET | Freq: Three times a day (TID) | ORAL | 0 refills | Status: DC | PRN

## 2022-11-16 MED ORDER — PROPOFOL 10 MG/ML IV BOLUS
INTRAVENOUS | Status: AC
  Filled 2022-11-16: qty 40

## 2022-11-16 MED ORDER — LIDOCAINE HCL (CARDIAC) PF 100 MG/5ML IV SOSY
PREFILLED_SYRINGE | INTRAVENOUS | Status: DC | PRN
  Administered 2022-11-16: 50 mg via INTRAVENOUS

## 2022-11-16 MED ORDER — ROCURONIUM BROMIDE 10 MG/ML (PF) SYRINGE
PREFILLED_SYRINGE | INTRAVENOUS | Status: AC
  Filled 2022-11-16: qty 10

## 2022-11-16 MED ORDER — CEFAZOLIN SODIUM-DEXTROSE 2-4 GM/100ML-% IV SOLN
INTRAVENOUS | Status: AC
  Filled 2022-11-16: qty 100

## 2022-11-16 MED ORDER — ONDANSETRON HCL 4 MG/2ML IJ SOLN
INTRAMUSCULAR | Status: DC | PRN
  Administered 2022-11-16: 4 mg via INTRAVENOUS

## 2022-11-16 MED ORDER — FENTANYL CITRATE (PF) 100 MCG/2ML IJ SOLN
INTRAMUSCULAR | Status: DC | PRN
  Administered 2022-11-16 (×2): 50 ug via INTRAVENOUS

## 2022-11-16 MED ORDER — HYDROCODONE-ACETAMINOPHEN 5-325 MG PO TABS: 1.0000 | ORAL_TABLET | Freq: Four times a day (QID) | ORAL | 0 refills | Status: DC | PRN

## 2022-11-16 MED ORDER — ROCURONIUM BROMIDE 100 MG/10ML IV SOLN
INTRAVENOUS | Status: DC | PRN
  Administered 2022-11-16: 20 mg via INTRAVENOUS
  Administered 2022-11-16: 50 mg via INTRAVENOUS

## 2022-11-16 MED ORDER — LIDOCAINE HCL (PF) 2 % IJ SOLN
INTRAMUSCULAR | Status: AC
  Filled 2022-11-16: qty 5

## 2022-11-16 MED ORDER — OXYCODONE HCL 5 MG PO TABS: 5.0000 mg | ORAL_TABLET | Freq: Once | ORAL | Status: DC | PRN

## 2022-11-16 MED ORDER — DEXAMETHASONE SODIUM PHOSPHATE 10 MG/ML IJ SOLN
INTRAMUSCULAR | Status: DC | PRN
  Administered 2022-11-16: 4 mg via INTRAVENOUS

## 2022-11-16 MED ORDER — DEXAMETHASONE SODIUM PHOSPHATE 10 MG/ML IJ SOLN
INTRAMUSCULAR | Status: AC
  Filled 2022-11-16: qty 1

## 2022-11-16 MED ORDER — PROPOFOL 10 MG/ML IV BOLUS
INTRAVENOUS | Status: DC | PRN
  Administered 2022-11-16: 150 mg via INTRAVENOUS

## 2022-11-16 MED ORDER — OXYCODONE HCL 5 MG/5ML PO SOLN: 5.0000 mg | Freq: Once | ORAL | Status: DC | PRN

## 2022-11-16 MED ORDER — SODIUM CHLORIDE 0.9 % IR SOLN
Status: DC | PRN
  Administered 2022-11-16: 3000 mL
  Administered 2022-11-16 (×3): 6000 mL
  Administered 2022-11-16: 3000 mL

## 2022-11-16 MED ORDER — ONDANSETRON HCL 4 MG/2ML IJ SOLN
INTRAMUSCULAR | Status: AC
  Filled 2022-11-16: qty 2

## 2022-11-16 MED ORDER — SUGAMMADEX SODIUM 200 MG/2ML IV SOLN
INTRAVENOUS | Status: DC | PRN
  Administered 2022-11-16: 186 mg via INTRAVENOUS

## 2022-11-16 MED ORDER — PHENYLEPHRINE 80 MCG/ML (10ML) SYRINGE FOR IV PUSH (FOR BLOOD PRESSURE SUPPORT)
PREFILLED_SYRINGE | INTRAVENOUS | Status: DC | PRN
  Administered 2022-11-16 (×2): 80 ug via INTRAVENOUS

## 2022-11-16 MED ORDER — CHLORHEXIDINE GLUCONATE 0.12 % MT SOLN
OROMUCOSAL | Status: AC
  Administered 2022-11-16: 15 mL via OROMUCOSAL
  Filled 2022-11-16: qty 15

## 2022-11-16 MED ORDER — EPHEDRINE 5 MG/ML INJ
INTRAVENOUS | Status: AC
  Filled 2022-11-16: qty 5

## 2022-11-16 MED ORDER — FENTANYL CITRATE (PF) 100 MCG/2ML IJ SOLN
INTRAMUSCULAR | Status: AC
  Filled 2022-11-16: qty 2

## 2022-11-16 MED ORDER — EPHEDRINE SULFATE (PRESSORS) 50 MG/ML IJ SOLN
INTRAMUSCULAR | Status: DC | PRN
  Administered 2022-11-16: 5 mg via INTRAVENOUS

## 2022-11-16 MED ORDER — FENTANYL CITRATE (PF) 100 MCG/2ML IJ SOLN: 25.0000 ug | INTRAMUSCULAR | Status: DC | PRN

## 2022-11-16 SURGICAL SUPPLY — 25 items
ADAPTER IRRIG TUBE 2 SPIKE SOL (ADAPTER) ×2 IMPLANT
BAG DRAIN SIEMENS DORNER NS (MISCELLANEOUS) ×1 IMPLANT
BAG URO DRAIN 4000ML (MISCELLANEOUS) ×1 IMPLANT
CATH FOL 2WAY LX 20X30 (CATHETERS) IMPLANT
CATH FOL 2WAY LX 24X30 (CATHETERS) IMPLANT
DRAPE UTILITY 15X26 TOWEL STRL (DRAPES) ×1 IMPLANT
ELECT LOOP 22F BIPOLAR SML (ELECTROSURGICAL)
ELECTRODE LOOP 22F BIPOLAR SML (ELECTROSURGICAL) IMPLANT
GAUZE 4X4 16PLY ~~LOC~~+RFID DBL (SPONGE) ×2 IMPLANT
GLOVE BIO SURGEON STRL SZ 6.5 (GLOVE) ×1 IMPLANT
GOWN STRL REUS W/ TWL LRG LVL3 (GOWN DISPOSABLE) ×2 IMPLANT
GOWN STRL REUS W/TWL LRG LVL3 (GOWN DISPOSABLE) ×2
HOLDER FOLEY CATH W/STRAP (MISCELLANEOUS) ×1 IMPLANT
IV NS IRRIG 3000ML ARTHROMATIC (IV SOLUTION) ×6 IMPLANT
KIT TURNOVER CYSTO (KITS) ×1 IMPLANT
LOOP CUT BIPOLAR 24F LRG (ELECTROSURGICAL) IMPLANT
MANIFOLD NEPTUNE II (INSTRUMENTS) ×1 IMPLANT
PACK CYSTO AR (MISCELLANEOUS) ×1 IMPLANT
SET IRRIG Y TYPE TUR BLADDER L (SET/KITS/TRAYS/PACK) ×1 IMPLANT
SURGILUBE 2OZ TUBE FLIPTOP (MISCELLANEOUS) ×1 IMPLANT
SYR TOOMEY IRRIG 70ML (MISCELLANEOUS) ×1
SYRINGE TOOMEY IRRIG 70ML (MISCELLANEOUS) ×1 IMPLANT
TRAP FLUID SMOKE EVACUATOR (MISCELLANEOUS) ×1 IMPLANT
WATER STERILE IRR 1000ML POUR (IV SOLUTION) ×1 IMPLANT
WATER STERILE IRR 500ML POUR (IV SOLUTION) ×1 IMPLANT

## 2022-11-16 NOTE — Transfer of Care (Signed)
Immediate Anesthesia Transfer of Care Note  Patient: Zachary Chase  Procedure(s) Performed: TRANSURETHRAL RESECTION OF THE PROSTATE (TURP)  Patient Location: PACU  Anesthesia Type:General  Level of Consciousness: awake  Airway & Oxygen Therapy: Patient Spontanous Breathing and Patient connected to face mask oxygen  Post-op Assessment: Report given to RN and Post -op Vital signs reviewed and stable  Post vital signs: stable  Last Vitals:  Vitals Value Taken Time  BP 137/60 11/16/22 1104  Temp    Pulse 80 11/16/22 1107  Resp 14 11/16/22 1107  SpO2 98 % 11/16/22 1107  Vitals shown include unvalidated device data.  Last Pain:  Vitals:   11/16/22 0805  TempSrc: Oral  PainSc: 0-No pain         Complications: No notable events documented.

## 2022-11-16 NOTE — Anesthesia Procedure Notes (Signed)
Procedure Name: Intubation Date/Time: 11/16/2022 9:56 AM  Performed by: Jacqualin Combes, CRNAPre-anesthesia Checklist: Patient identified, Emergency Drugs available, Suction available and Patient being monitored Patient Re-evaluated:Patient Re-evaluated prior to induction Oxygen Delivery Method: Circle system utilized Preoxygenation: Pre-oxygenation with 100% oxygen Induction Type: IV induction Ventilation: Two handed mask ventilation required and Oral airway inserted - appropriate to patient size Laryngoscope Size: McGraph and 4 Grade View: Grade I Tube type: Oral Tube size: 7.5 mm Number of attempts: 1 Airway Equipment and Method: Stylet and Oral airway Placement Confirmation: ETT inserted through vocal cords under direct vision, positive ETCO2 and breath sounds checked- equal and bilateral Secured at: 22 cm Tube secured with: Tape Dental Injury: Teeth and Oropharynx as per pre-operative assessment  Comments: Cords clear; 2 person mask with OPA. CA

## 2022-11-16 NOTE — Anesthesia Preprocedure Evaluation (Signed)
Anesthesia Evaluation  Patient identified by MRN, date of birth, ID band Patient awake    Reviewed: Allergy & Precautions, NPO status , Patient's Chart, lab work & pertinent test results  History of Anesthesia Complications Negative for: history of anesthetic complications  Airway Mallampati: III  TM Distance: <3 FB Neck ROM: full    Dental  (+) Chipped   Pulmonary shortness of breath and with exertion, asthma , sleep apnea , former smoker   Pulmonary exam normal        Cardiovascular hypertension, (-) angina (-) Past MI Normal cardiovascular exam     Neuro/Psych negative neurological ROS  negative psych ROS   GI/Hepatic Neg liver ROS, PUD,GERD  Controlled,,  Endo/Other  negative endocrine ROSneg diabetes    Renal/GU      Musculoskeletal   Abdominal   Peds  Hematology negative hematology ROS (+)   Anesthesia Other Findings Past Medical History: No date: Anemia No date: Arthritis No date: Asthma No date: Back pain No date: BPH (benign prostatic hyperplasia) No date: Cancer (HCC)     Comment:  skin No date: Dermatophytosis of nail No date: Dyspnea No date: Enthesopathy of hip region No date: Gastric ulcer No date: GERD (gastroesophageal reflux disease) No date: H/O multiple allergies No date: History of colonic polyps No date: Hx of onychia and paronychia No date: Hypersomnia No date: Hypertension No date: Lower extremity edema No date: Pneumonia No date: Prostatic hypertrophy No date: Psoriasis No date: Pure hypercholesterolemia No date: Sleep apnea 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 No date: Tremor     Comment:  of the head No date: Wheezing  Past Surgical History: 09/01/2016: CATARACT EXTRACTION W/PHACO; Right     Comment:  Procedure: CATARACT EXTRACTION PHACO AND INTRAOCULAR               LENS PLACEMENT (IOC);  Surgeon: Birder Robson, MD;                Location: [facility] ORS;  Service:  Ophthalmology;  Laterality:              Right;  Lot# [id number] H [place]: 00:34.0 AP%:21.8 CDE: 7.39  07/13/2019: CATARACT EXTRACTION W/PHACO; Left     Comment:  Procedure: CATARACT EXTRACTION PHACO AND INTRAOCULAR               LENS PLACEMENT ([place]) LEFT;  Surgeon: Birder Robson,               MD;  Location: [facility] ORS;  Service: Ophthalmology;                Laterality: Left;  [place] 00:37 CDE 6.43 Fluid pack lot #               [id number] 05/07/2019: CHOLECYSTECTOMY; N/A     Comment:  Procedure: LAPAROSCOPIC CHOLECYSTECTOMY;  Surgeon:               Jules Husbands, MD;  Location: [facility] ORS;  Service:               General;  Laterality: N/A; 11/30/2018: COLONOSCOPY WITH PROPOFOL; N/A     Comment:  Procedure: COLONOSCOPY WITH PROPOFOL;  Surgeon: Manya Silvas, MD;  Location: [facility] ENDOSCOPY;  Service:               Endoscopy;  Laterality: N/A; 11/30/2018: ESOPHAGOGASTRODUODENOSCOPY (EGD) WITH PROPOFOL; N/A     Comment:  Procedure: ESOPHAGOGASTRODUODENOSCOPY (EGD) WITH  PROPOFOL;  Surgeon: Manya Silvas, MD;  Location:               [facility] ENDOSCOPY;  Service: Endoscopy;  Laterality: N/A; No date: EYE SURGERY No date: sleep apnea surgery No date: TONSILLECTOMY  BMI    Body Mass Index: 36.32 kg/m      Reproductive/Obstetrics negative OB ROS                             Anesthesia Physical Anesthesia Plan  ASA: 3  Anesthesia Plan: General ETT   Post-op Pain Management:    Induction: Intravenous  PONV Risk Score and Plan: Ondansetron, Dexamethasone, Midazolam and Treatment may vary due to age or medical condition  Airway Management Planned: Oral ETT  Additional Equipment:   Intra-op Plan:   Post-operative Plan: Extubation in OR  Informed Consent: I have reviewed the patients History and Physical, chart, labs and discussed the procedure including the risks, benefits and alternatives for the proposed anesthesia with the  patient or authorized representative who has indicated his/her understanding and acceptance.     Dental Advisory Given  Plan Discussed with: Anesthesiologist, CRNA and Surgeon  Anesthesia Plan Comments: (Patient consented for risks of anesthesia including but not limited to:  - adverse reactions to medications - damage to eyes, teeth, lips or other oral mucosa - nerve damage due to positioning  - sore throat or hoarseness - Damage to heart, brain, nerves, lungs, other parts of body or loss of life  Patient voiced understanding.)       Anesthesia Quick Evaluation

## 2022-11-16 NOTE — Op Note (Signed)
Transurethral Resection of the Prostate Procedure Note  Indications: The patient has bladder outlet obstruction secondary to BPH and has elected to proceed with TURP as definitive management.   Pre-operative Diagnosis: BPH  Post-operative Diagnosis: same  Surgeon: Hollice Espy   Assistants: none  Anesthesia: General endotracheal anesthesia  Procedure Details  The patient was seen in the holding room. The risks, benefits, complications, treatment options, and expected outcomes were discussed with the patient. The possibilities of reaction to medication, pulmonary aspiration, perforation of viscus, bleeding, recurrent infection, the need for additional procedures, failure to diagnose a condition, and creating a complication requiring transfusion or operation were discussed with the patient. The patient concurred with the proposed plan, giving informed consent.  The site of surgery properly noted/marked. The patient was taken to operating room # 10, identified as Zachary Chase and the procedure verified as Transurethral Resection of the Prostate. A Time Out was held and the above information confirmed.  After the induction of satisfactory anesthesia the patient was placed in the dorsal lithotomy position and prepped and draped in the usual sterile fashion.  The 37 French continuous flow resectoscope was introduced into the bladder using a blunt angled obturator then switched out for a working element.. The ureteral orifices were identified. The bipolar loop working element was used for the resection and saline was the irrigating fluid. The resection was begun with the median lobe tissue followed by the anterior and then lateral lobe tissue. The floor of the prostatic fossa and apical tissue were removed to complete the resection. Capsular fibers were identified and there were no capsular perforations. Hemostasis was achieved with coagulation current. The chips were recovered from the bladder  using a Toomey evacuator.  The ureteral orifices were noted to be uninjured after the resection. A 20 French 2 way Foley catheter 50 cc was inserted at the conclusion of the procedure.  Hemostasis was excellent.  Instrument, sponge, and needle counts were correct prior the abdominal closure and at the conclusion of the case.   Findings: Trilobar coaptation with large median lobe.  Moderately trabeculated bladder.  Estimate Blood Loss:  Minimal         Drains: 41 French Foley catheter with a 50 cc in the balloon                 Specimens: Specimen:  prostate chips  Complications:  None; patient tolerated the procedure well.         Disposition: PACU - hemodynamically stable.         Condition: stable  Follow-up plan: Patient will return for voiding trial in 2 days.  Will see him in 6 weeks with IPSS PVR at which time we will consider weaning him from his BPH medications.

## 2022-11-16 NOTE — Discharge Instructions (Signed)
AMBULATORY SURGERY  ?DISCHARGE INSTRUCTIONS ? ? ?The drugs that you were given will stay in your system until tomorrow so for the next 24 hours you should not: ? ?Drive an automobile ?Make any legal decisions ?Drink any alcoholic beverage ? ? ?You may resume regular meals tomorrow.  Today it is better to start with liquids and gradually work up to solid foods. ? ?You may eat anything you prefer, but it is better to start with liquids, then soup and crackers, and gradually work up to solid foods. ? ? ?Please notify your doctor immediately if you have any unusual bleeding, trouble breathing, redness and pain at the surgery site, drainage, fever, or pain not relieved by medication. ? ? ? ?Additional Instructions: ? ? ? ?Please contact your physician with any problems or Same Day Surgery at [phone], Monday through Friday 6 am to 4 pm, or [place] [facility] number at [phone].  ?

## 2022-11-16 NOTE — H&P (Signed)
11/16/22 RRR CTAB      Chief Complaint  Patient presents with   Trus/Cysto        HPI: 77 y.o. year-old male with chronic urinary retention who presents today for further evaluation    Please see previous notes for details.     Blood pressure 125/76, pulse 60, height '5\' 3"'$  (1.6 m). NED. A&Ox3.   No respiratory distress   Abd soft, NT, ND Normal phallus with bilateral descended testicles    Past Medical History:  Diagnosis Date   Anemia    Arthritis    Asthma    Back pain    BPH (benign prostatic hyperplasia)    Cancer (HCC)    skin   Dermatophytosis of nail    Dyspnea    Enthesopathy of hip region    Gastric ulcer    GERD (gastroesophageal reflux disease)    H/O multiple allergies    History of colonic polyps    Hx of onychia and paronychia    Hypersomnia    Hypertension    Lower extremity edema    Pneumonia    Prostatic hypertrophy    Psoriasis    Pure hypercholesterolemia    Sleep apnea    Testicular hypofunction    Tremor    of the head   Wheezing    Current Meds  Medication Sig   albuterol (PROVENTIL HFA;VENTOLIN HFA) 108 (90 Base) MCG/ACT inhaler Inhale 1 puff into the lungs every 4 (four) hours as needed for wheezing or shortness of breath.   fexofenadine (ALLEGRA) 180 MG tablet Take 180 mg by mouth daily.   finasteride (PROSCAR) 5 MG tablet Take 5 mg by mouth daily.   fluticasone (FLONASE) 50 MCG/ACT nasal spray Place 2 sprays into both nostrils daily as needed for allergies or rhinitis.    glucosamine-chondroitin 500-400 MG tablet Take 2 tablets by mouth daily.   hydrocortisone cream 1 % Apply 1 application topically 2 (two) times daily.   lisinopril-hydrochlorothiazide (ZESTORETIC) 20-12.5 MG tablet Take 0.5 tablets by mouth daily.   Melatonin 10 MG TABS Take 1 tablet by mouth at bedtime.   pantoprazole (PROTONIX) 40 MG tablet Take 40 mg by mouth daily.   tamsulosin (FLOMAX) 0.4 MG CAPS capsule Take 0.4 mg by mouth 2 (two) times daily.    WIXELA INHUB 250-50 MCG/DOSE AEPB Inhale 1 puff into the lungs 2 (two) times a day.      Cystoscopy Procedure Note   Patient identification was confirmed, informed consent was obtained, and patient was prepped using Betadine solution.  Lidocaine jelly was administered per urethral meatus.     Preoperative abx where received prior to procedure.       Pre-Procedure: - Inspection reveals a normal caliber ureteral meatus.   Procedure: The flexible cystoscope was introduced without difficulty - No urethral strictures/lesions are present. - Enlarged prostate trilobar cap - Elevated bladder neck - Bilateral ureteral orifices identified - Bladder mucosa  reveals no ulcers, tumors, or lesions - No bladder stones - Mild trabeculation   Retroflexion shows small well circumcised median lobe     Post-Procedure: - Patient tolerated the procedure well     Prostate transrectal ultrasound sizing   Informed consent was obtained after discussing risks/benefits of the procedure.  A time out was performed to ensure correct patient identity.   Pre-Procedure: -Transrectal probe was placed without difficulty -Transrectal Ultrasound performed revealing a 64.6 gm prostate measuring 4.25 x 5.29 x 5.50 cm (length) -Median lobe noted  Assessment/ Plan:   1. Benign prostatic hyperplasia with incomplete bladder emptying Strongly recommend TURP vs. HoLEP to address outlet and chronic retention, failed maximal medical therapy   He is most interested in TURP, risk of bleeding, infection, damage to surrounding structures, need for foley, bladder neck contracture, retrograde ejaculation, failure to resolve urinary symptoms all discussed.  All questions answered.  Literature given today. - Urinalysis, Complete - CULTURE, URINE COMPREHENSIVE   2. OAB (overactive bladder) Continue to hold oxybutynin due to chronic retention  - Urinalysis, Complete - CULTURE, URINE COMPREHENSIVE       Hollice Espy, MD

## 2022-11-17 ENCOUNTER — Encounter: Payer: Self-pay | Admitting: Urology

## 2022-11-17 LAB — SURGICAL PATHOLOGY

## 2022-11-17 NOTE — Progress Notes (Signed)
11/18/2022 4:31 PM   Zachary Chase Zachary Chase 01-Jan-1946 [id number]  Referring provider: Dorothey Baseman, MD [phone] [place],  [place] [address]  Urological history: 1. Epididymal cyst -scrotal US (10/2021) - Stable 2.1 cm right epididymal cyst  2. Hydrocele -scrotal US (10/2021) - Interval large right hydrocele  3. Varicocele -scrotal US (10/2021) - Possible mild left varicocele  4. BPH w/ LU TS -PSA (07/2021) 0.96 -finasteride 5 mg daily   5. Urinary frequency -contributing factors of age, BPH, incomplete bladder emptying, HTN, sleep apnea, lumber DDD, obesity and former smoker -PVR 615 ml  -oxybutynin 5 mg twice daily    Chief Complaint  Patient presents with   Routine Post Op    HPI: Zachary Chase is a 77 y.o. male who presents today for trial of void after undergoing TURP on January 22 with Dr. Vanna Scotland.  Prostate chips pathology negative.  He states he drink copious amount of fluids today and experienced urinary frequency.  He states that when he used the restroom he was voiding good amount of urine each time.  He feels he is emptying his bladder adequately.    Patient denies any modifying or aggravating factors.  Patient denies any gross hematuria, dysuria or suprapubic/flank pain.  Patient denies any fevers, chills, nausea or vomiting.    PVR 41 mL  He is taking tamsulosin 0.4 mg twice daily.  He is concerned about his urinary frequency as he and his wife go out to eat every night.  PMH: Past Medical History:  Diagnosis Date   Anemia    Arthritis    Asthma    Back pain    BPH (benign prostatic hyperplasia)    Cancer (HCC)    skin   Dermatophytosis of nail    Dyspnea    Enthesopathy of hip region    Gastric ulcer    GERD (gastroesophageal reflux disease)    H/O multiple allergies    History of colonic polyps    Hx of onychia and paronychia    Hypersomnia    Hypertension    Lower extremity edema    Pneumonia    Prostatic  hypertrophy    Psoriasis    Pure hypercholesterolemia    Sleep apnea    Testicular hypofunction    Tremor    of the head   Wheezing     Surgical History: Past Surgical History:  Procedure Laterality Date   CATARACT EXTRACTION W/PHACO Right 09/01/2016   Procedure: CATARACT EXTRACTION PHACO AND INTRAOCULAR LENS PLACEMENT (IOC);  Surgeon: Galen Manila, MD;  Location: [facility] ORS;  Service: Ophthalmology;  Laterality: Right;  Lot# [id number] H [place]: 00:34.0 AP%:21.8 CDE: 7.39    CATARACT EXTRACTION W/PHACO Left 07/13/2019   Procedure: CATARACT EXTRACTION PHACO AND INTRAOCULAR LENS PLACEMENT (IOC) LEFT;  Surgeon: Galen Manila, MD;  Location: [facility];  Service: Ophthalmology;  Laterality: Left;  [place] 00:37 CDE 6.43 Fluid pack lot # [id number]   CHOLECYSTECTOMY N/A 05/07/2019   Procedure: LAPAROSCOPIC CHOLECYSTECTOMY;  Surgeon: Leafy Ro, MD;  Location: [facility] ORS;  Service: General;  Laterality: N/A;   COLONOSCOPY WITH PROPOFOL N/A 11/30/2018   Procedure: COLONOSCOPY WITH PROPOFOL;  Surgeon: Scot Jun, MD;  Location: [facility] ENDOSCOPY;  Service: Endoscopy;  Laterality: N/A;   ESOPHAGOGASTRODUODENOSCOPY (EGD) WITH PROPOFOL N/A 11/30/2018   Procedure: ESOPHAGOGASTRODUODENOSCOPY (EGD) WITH PROPOFOL;  Surgeon: Scot Jun, MD;  Location: [facility] ENDOSCOPY;  Service: Endoscopy;  Laterality: N/A;   EYE SURGERY     sleep apnea  surgery     TONSILLECTOMY     TRANSURETHRAL RESECTION OF PROSTATE N/A 11/16/2022   Procedure: TRANSURETHRAL RESECTION OF THE PROSTATE (TURP);  Surgeon: Vanna Scotland, MD;  Location: [facility] ORS;  Service: Urology;  Laterality: N/A;    Home Medications:  Allergies as of 11/18/2022       Reactions   Tetracyclines & Related Nausea And Vomiting        Medication List        Accurate as of November 18, 2022  4:31 PM. If you have any questions, ask your nurse or doctor.          albuterol 108 (90 Base) MCG/ACT inhaler Commonly known as: VENTOLIN  HFA Inhale 1 puff into the lungs every 4 (four) hours as needed for wheezing or shortness of breath.   augmented betamethasone dipropionate 0.05 % ointment Commonly known as: DIPROLENE-AF Apply 1 application topically 2 (two) times daily.   fexofenadine 180 MG tablet Commonly known as: ALLEGRA Take 180 mg by mouth daily.   finasteride 5 MG tablet Commonly known as: PROSCAR Take 5 mg by mouth daily.   fluticasone 50 MCG/ACT nasal spray Commonly known as: FLONASE Place 2 sprays into both nostrils daily as needed for allergies or rhinitis.   glucosamine-chondroitin 500-400 MG tablet Take 2 tablets by mouth daily.   HYDROcodone-acetaminophen 5-325 MG tablet Commonly known as: NORCO/VICODIN Take 1-2 tablets by mouth every 6 (six) hours as needed for moderate pain.   hydrocortisone cream 1 % Apply 1 application topically 2 (two) times daily.   lisinopril-hydrochlorothiazide 20-12.5 MG tablet Commonly known as: ZESTORETIC Take 0.5 tablets by mouth daily.   Melatonin 10 MG Tabs Take 1 tablet by mouth at bedtime.   oxybutynin 5 MG tablet Commonly known as: DITROPAN Take 1 tablet (5 mg total) by mouth every 8 (eight) hours as needed for bladder spasms.   pantoprazole 40 MG tablet Commonly known as: PROTONIX Take 40 mg by mouth daily.   tamsulosin 0.4 MG Caps capsule Commonly known as: FLOMAX Take 0.4 mg by mouth 2 (two) times daily.   Wixela Inhub 250-50 MCG/ACT Aepb Generic drug: fluticasone-salmeterol Inhale 1 puff into the lungs 2 (two) times a day.        Allergies:  Allergies  Allergen Reactions   Tetracyclines & Related Nausea And Vomiting    Family History: No family history on file.  Social History:  reports that he has quit smoking. He has never used smokeless tobacco. He reports current alcohol use. He reports that he does not currently use drugs.  ROS: Pertinent ROS in HPI  Physical Exam: Constitutional:  Well nourished. Alert and oriented, No  acute distress. HEENT: [place] AT, moist mucus membranes.  Trachea midline Cardiovascular: No clubbing, cyanosis, or edema. Respiratory: Normal respiratory effort, no increased work of breathing. Neurologic: Grossly intact, no focal deficits, moving all 4 extremities. Psychiatric: Normal mood and affect.   Laboratory Data: N/A  Pertinent Imaging:  11/18/22 15:56  Scan Result 41    Catheter Removal Patient is present today for a catheter removal.  50 ml of water was drained from the balloon. A 20 FR foley cath was removed from the bladder, no complications were noted. Patient tolerated well.  Performed by: Michiel Cowboy, PA-C   Assessment & Plan:    1. BPH with LUTS -s/p TURP (10/2022) -PVR demonstrates adequate emptying -Advised him only to take tamsulosin once daily for the next couple weeks and then eventually wean off the medication -He will  also take the oxybutynin 5 mg daily  2. OAB -Reassess at 6-week follow-up  No follow-ups on file.  These notes generated with voice recognition software. I apologize for typographical errors.  Cloretta Ned  [facility] [address]  [address] [place], [place] [address] [phone]

## 2022-11-17 NOTE — Anesthesia Postprocedure Evaluation (Signed)
Anesthesia Post Note  Patient: Zachary Chase  Procedure(s) Performed: TRANSURETHRAL RESECTION OF THE PROSTATE (TURP)  Patient location during evaluation: PACU Anesthesia Type: General Level of consciousness: awake and alert Pain management: pain level controlled Vital Signs Assessment: post-procedure vital signs reviewed and stable Respiratory status: spontaneous breathing, nonlabored ventilation, respiratory function stable and patient connected to nasal cannula oxygen Cardiovascular status: blood pressure returned to baseline and stable Postop Assessment: no apparent nausea or vomiting Anesthetic complications: no   No notable events documented.   Last Vitals:  Vitals:   11/16/22 1145 11/16/22 1157  BP: 125/67 (!) 133/53  Pulse: 63 65  Resp: 15 18  Temp:  (!) 35.8 C  SpO2: 97% 100%    Last Pain:  Vitals:   11/16/22 1157  TempSrc: Temporal  PainSc: 0-No pain                 Precious Haws Jacqulyne Gladue

## 2022-11-18 ENCOUNTER — Ambulatory Visit (INDEPENDENT_AMBULATORY_CARE_PROVIDER_SITE_OTHER): Payer: Medicare Other | Admitting: Urology

## 2022-11-18 ENCOUNTER — Encounter: Payer: Medicare Other | Admitting: Urology

## 2022-11-18 DIAGNOSIS — N138 Other obstructive and reflux uropathy: Secondary | ICD-10-CM | POA: Diagnosis not present

## 2022-11-18 DIAGNOSIS — N401 Enlarged prostate with lower urinary tract symptoms: Secondary | ICD-10-CM | POA: Diagnosis not present

## 2022-11-18 DIAGNOSIS — N3281 Overactive bladder: Secondary | ICD-10-CM

## 2022-11-18 LAB — BLADDER SCAN AMB NON-IMAGING: Scan Result: 41

## 2022-12-29 ENCOUNTER — Encounter: Payer: Self-pay | Admitting: Urology

## 2022-12-29 ENCOUNTER — Ambulatory Visit (INDEPENDENT_AMBULATORY_CARE_PROVIDER_SITE_OTHER): Payer: Medicare Other | Admitting: Urology

## 2022-12-29 VITALS — BP 130/77 | HR 71 | Ht 63.0 in | Wt 200.0 lb

## 2022-12-29 DIAGNOSIS — R3915 Urgency of urination: Secondary | ICD-10-CM

## 2022-12-29 DIAGNOSIS — N401 Enlarged prostate with lower urinary tract symptoms: Secondary | ICD-10-CM

## 2022-12-29 LAB — BLADDER SCAN AMB NON-IMAGING

## 2022-12-29 NOTE — Progress Notes (Signed)
Marcelle Overlie Plume,acting as a scribe for Vanna Scotland, MD.,have documented all relevant documentation on the behalf of Vanna Scotland, MD,as directed by  Vanna Scotland, MD while in the presence of Vanna Scotland, MD.  12/29/2022 11:17 AM   Zachary Chase [name] Jul 30, 1946 [id number]  Referring provider: Dorothey Baseman, MD [phone] [address] [place],  [place] [address]  Chief Complaint  Patient presents with   Post-op Follow-up    HPI: 77 year-old male who returns today for follow up.   He was taken to the operating room on 11/16/2022 for TURP. Prostate volume was 64.6 with a median lobe noted preoperatively. He was managed on Flomax and oxybutynin which had to be stopped for incomplete bladder emptying. Surgical pathology was consistent with 17 grams of benign tissue. He had a successful voiding trial 2 days post-op.   Today, he is still having intermittency and nocturia times 3 but overall, he is doing much better. He reports stopping his Flomax 2 weeks ago. Since he stopped he has noticed only minimal leakage and increased control of his voids. He is still taking finasteride.   Results for orders placed or performed in visit on 12/29/22  Bladder Scan (Post Void Residual) in office  Result Value Ref Range   Scan Result 0ml     IPSS     Row Name 12/29/22 1000         International Prostate Symptom Score   How often have you had the sensation of not emptying your bladder? Not at All     How often have you had to urinate less than every two hours? Not at All     How often have you found you stopped and started again several times when you urinated? More than half the time     How often have you found it difficult to postpone urination? More than half the time     How often have you had a weak urinary stream? Not at All     How often have you had to strain to start urination? Not at All     How many times did you typically get up at night to urinate? 3 Times     Total IPSS Score 11        Quality of Life due to urinary symptoms   If you were to spend the rest of your life with your urinary condition just the way it is now how would you feel about that? Mostly Satisfied              Score:  1-7 Mild 8-19 Moderate 20-35 Severe     PMH: Past Medical History:  Diagnosis Date   Anemia    Arthritis    Asthma    Back pain    BPH (benign prostatic hyperplasia)    Cancer (HCC)    skin   Dermatophytosis of nail    Dyspnea    Enthesopathy of hip region    Gastric ulcer    GERD (gastroesophageal reflux disease)    H/O multiple allergies    History of colonic polyps    Hx of onychia and paronychia    Hypersomnia    Hypertension    Lower extremity edema    Pneumonia    Prostatic hypertrophy    Psoriasis    Pure hypercholesterolemia    Sleep apnea    Testicular hypofunction    Tremor    of the head   Wheezing     Surgical  History: Past Surgical History:  Procedure Laterality Date   CATARACT EXTRACTION W/PHACO Right 09/01/2016   Procedure: CATARACT EXTRACTION PHACO AND INTRAOCULAR LENS PLACEMENT (IOC);  Surgeon: Galen Manila, MD;  Location: [facility] ORS;  Service: Ophthalmology;  Laterality: Right;  Lot# [id number] H [place]: 00:34.0 AP%:21.8 CDE: 7.39    CATARACT EXTRACTION W/PHACO Left 07/13/2019   Procedure: CATARACT EXTRACTION PHACO AND INTRAOCULAR LENS PLACEMENT (IOC) LEFT;  Surgeon: Galen Manila, MD;  Location: [facility] ORS;  Service: Ophthalmology;  Laterality: Left;  [place] 00:37 CDE 6.43 Fluid pack lot # [id number]   CHOLECYSTECTOMY N/A 05/07/2019   Procedure: LAPAROSCOPIC CHOLECYSTECTOMY;  Surgeon: Leafy Ro, MD;  Location: [facility] ORS;  Service: General;  Laterality: N/A;   COLONOSCOPY WITH PROPOFOL N/A 11/30/2018   Procedure: COLONOSCOPY WITH PROPOFOL;  Surgeon: Scot Jun, MD;  Location: [facility] ENDOSCOPY;  Service: Endoscopy;  Laterality: N/A;   ESOPHAGOGASTRODUODENOSCOPY (EGD) WITH PROPOFOL N/A 11/30/2018   Procedure:  ESOPHAGOGASTRODUODENOSCOPY (EGD) WITH PROPOFOL;  Surgeon: Scot Jun, MD;  Location[facility] ENDOSCOPY;  Service: Endoscopy;  Laterality: N/A;   EYE SURGERY     sleep apnea surgery     TONSILLECTOMY     TRANSURETHRAL RESECTION OF PROSTATE N/A 11/16/2022   Procedure: TRANSURETHRAL RESECTION OF THE PROSTATE (TURP);  Surgeon: Vanna Scotland, MD;  Location: [facility] ORS;  Service: Urology;  Laterality: N/A;    Home Medications:  Allergies as of 12/29/2022       Reactions   Tetracyclines & Related Nausea And Vomiting        Medication List        Accurate as of December 29, 2022 11:17 AM. If you have any questions, ask your nurse or doctor.          STOP taking these medications    HYDROcodone-acetaminophen 5-325 MG tablet Commonly known as: NORCO/VICODIN Stopped by: Vanna Scotland, MD   oxybutynin 5 MG tablet Commonly known as: DITROPAN Stopped by: Vanna Scotland, MD       TAKE these medications    albuterol 108 (90 Base) MCG/ACT inhaler Commonly known as: VENTOLIN HFA Inhale 1 puff into the lungs every 4 (four) hours as needed for wheezing or shortness of breath.   augmented betamethasone dipropionate 0.05 % ointment Commonly known as: DIPROLENE-AF Apply 1 application topically 2 (two) times daily.   fexofenadine 180 MG tablet Commonly known as: ALLEGRA Take 180 mg by mouth daily.   finasteride 5 MG tablet Commonly known as: PROSCAR Take 5 mg by mouth daily.   fluticasone 50 MCG/ACT nasal spray Commonly known as: FLONASE Place 2 sprays into both nostrils daily as needed for allergies or rhinitis.   glucosamine-chondroitin 500-400 MG tablet Take 2 tablets by mouth daily.   hydrocortisone cream 1 % Apply 1 application topically 2 (two) times daily.   lisinopril-hydrochlorothiazide 20-12.5 MG tablet Commonly known as: ZESTORETIC Take 0.5 tablets by mouth daily.   Melatonin 10 MG Tabs Take 1 tablet by mouth at bedtime.   pantoprazole 40 MG  tablet Commonly known as: PROTONIX Take 40 mg by mouth daily.   tamsulosin 0.4 MG Caps capsule Commonly known as: FLOMAX Take 0.4 mg by mouth 2 (two) times daily.   Wixela Inhub 250-50 MCG/ACT Aepb Generic drug: fluticasone-salmeterol Inhale 1 puff into the lungs 2 (two) times a day.        Allergies:  Allergies  Allergen Reactions   Tetracyclines & Related Nausea And Vomiting     Social History:  reports that he has quit smoking. He has  never used smokeless tobacco. He reports current alcohol use. He reports that he does not currently use drugs.   Physical Exam: BP 130/77   Pulse 71   Ht 5\' 3"  (1.6 m)   Wt 200 lb (90.7 kg)   BMI 35.43 kg/m   Constitutional:  Alert and oriented, No acute distress. HEENT: [place] AT, moist mucus membranes.  Trachea midline, no masses. Neurologic: Grossly intact, no focal deficits, moving all 4 extremities. Psychiatric: Normal mood and affect.  Assessment & Plan:    1. BPH with urinary urgency - He has weaned himself off of the Flomax. - Continue taking finasteride for the time being to help prostatic regrowth. - He also appreciated the side effect benefit of hair growth. - Plan on a return in 6 months for IPSS and PVR with Carollee Herter. - Anticipate continued improvement in his urinary symptoms including irritative urinary symptoms.   Return in about 6 months (around 07/01/2023) for for IPSS and PVR.  I have reviewed the above documentation for accuracy and completeness, and I agree with the above.   Vanna Scotland, MD   [facility] [address] [place], [place] [address] [phone]

## 2023-01-06 ENCOUNTER — Encounter
Admission: RE | Disposition: A | Discharge status: Home / Self Care | Payer: Self-pay | Source: Ambulatory Visit | Attending: Internal Medicine

## 2023-01-06 ENCOUNTER — Ambulatory Visit: Payer: Medicare Other | Admitting: Anesthesiology

## 2023-01-06 ENCOUNTER — Ambulatory Visit
Admission: RE | Admit: 2023-01-06 | Discharge: 2023-01-06 | Disposition: A | Discharge status: Home / Self Care | Payer: Medicare Other | Source: Ambulatory Visit | Attending: Internal Medicine | Admitting: Internal Medicine

## 2023-01-06 ENCOUNTER — Encounter: Payer: Self-pay | Admitting: Internal Medicine

## 2023-01-06 DIAGNOSIS — J45909 Unspecified asthma, uncomplicated: Secondary | ICD-10-CM | POA: Insufficient documentation

## 2023-01-06 DIAGNOSIS — K5730 Diverticulosis of large intestine without perforation or abscess without bleeding: Secondary | ICD-10-CM | POA: Diagnosis not present

## 2023-01-06 DIAGNOSIS — K641 Second degree hemorrhoids: Secondary | ICD-10-CM | POA: Insufficient documentation

## 2023-01-06 DIAGNOSIS — Z86010 Personal history of colonic polyps: Secondary | ICD-10-CM | POA: Diagnosis not present

## 2023-01-06 DIAGNOSIS — K2100 Gastro-esophageal reflux disease with esophagitis, without bleeding: Secondary | ICD-10-CM | POA: Insufficient documentation

## 2023-01-06 DIAGNOSIS — G4730 Sleep apnea, unspecified: Secondary | ICD-10-CM | POA: Insufficient documentation

## 2023-01-06 DIAGNOSIS — D128 Benign neoplasm of rectum: Secondary | ICD-10-CM | POA: Insufficient documentation

## 2023-01-06 DIAGNOSIS — K222 Esophageal obstruction: Secondary | ICD-10-CM | POA: Insufficient documentation

## 2023-01-06 DIAGNOSIS — E7800 Pure hypercholesterolemia, unspecified: Secondary | ICD-10-CM | POA: Insufficient documentation

## 2023-01-06 DIAGNOSIS — K2270 Barrett's esophagus without dysplasia: Secondary | ICD-10-CM | POA: Diagnosis not present

## 2023-01-06 DIAGNOSIS — N400 Enlarged prostate without lower urinary tract symptoms: Secondary | ICD-10-CM | POA: Insufficient documentation

## 2023-01-06 DIAGNOSIS — Z8711 Personal history of peptic ulcer disease: Secondary | ICD-10-CM | POA: Diagnosis not present

## 2023-01-06 DIAGNOSIS — Z1211 Encounter for screening for malignant neoplasm of colon: Secondary | ICD-10-CM | POA: Insufficient documentation

## 2023-01-06 DIAGNOSIS — I10 Essential (primary) hypertension: Secondary | ICD-10-CM | POA: Diagnosis not present

## 2023-01-06 DIAGNOSIS — D123 Benign neoplasm of transverse colon: Secondary | ICD-10-CM | POA: Diagnosis not present

## 2023-01-06 DIAGNOSIS — Z13810 Encounter for screening for upper gastrointestinal disorder: Secondary | ICD-10-CM | POA: Insufficient documentation

## 2023-01-06 DIAGNOSIS — K449 Diaphragmatic hernia without obstruction or gangrene: Secondary | ICD-10-CM | POA: Diagnosis not present

## 2023-01-06 HISTORY — PX: ESOPHAGOGASTRODUODENOSCOPY: SHX5428

## 2023-01-06 HISTORY — PX: COLONOSCOPY: SHX5424

## 2023-01-06 LAB — KOH PREP

## 2023-01-06 SURGERY — COLONOSCOPY
Anesthesia: General

## 2023-01-06 MED ORDER — LIDOCAINE HCL (PF) 2 % IJ SOLN
INTRAMUSCULAR | Status: AC
  Filled 2023-01-06: qty 5

## 2023-01-06 MED ORDER — SODIUM CHLORIDE 0.9 % IV SOLN: INTRAVENOUS | Status: DC

## 2023-01-06 MED ORDER — LIDOCAINE HCL (CARDIAC) PF 100 MG/5ML IV SOSY
PREFILLED_SYRINGE | INTRAVENOUS | Status: DC | PRN
  Administered 2023-01-06: 100 mg via INTRAVENOUS

## 2023-01-06 MED ORDER — PROPOFOL 1000 MG/100ML IV EMUL
INTRAVENOUS | Status: AC
  Filled 2023-01-06: qty 100

## 2023-01-06 MED ORDER — PROPOFOL 500 MG/50ML IV EMUL
INTRAVENOUS | Status: DC | PRN
  Administered 2023-01-06: 150 ug/kg/min via INTRAVENOUS

## 2023-01-06 NOTE — Anesthesia Postprocedure Evaluation (Signed)
Anesthesia Post Note  Patient: Zachary Chase  Procedure(s) Performed: COLONOSCOPY ESOPHAGOGASTRODUODENOSCOPY (EGD)  Patient location during evaluation: Endoscopy Anesthesia Type: General Level of consciousness: awake and alert Pain management: pain level controlled Vital Signs Assessment: post-procedure vital signs reviewed and stable Respiratory status: spontaneous breathing, nonlabored ventilation, respiratory function stable and patient connected to nasal cannula oxygen Cardiovascular status: blood pressure returned to baseline and stable Postop Assessment: no apparent nausea or vomiting Anesthetic complications: no  There were no known notable events for this encounter.   Last Vitals:  Vitals:   01/06/23 0921 01/06/23 0930  BP: (!) 101/59 121/77  Pulse:    Resp:    Temp:    SpO2:      Last Pain:  Vitals:   01/06/23 0930  TempSrc:   PainSc: 0-No pain                 Dimas Millin

## 2023-01-06 NOTE — Interval H&P Note (Signed)
History and Physical Interval Note:  01/06/2023 8:20 AM  Zachary Chase  has presented today for surgery, with the diagnosis of Barrett's esophagus without dysplasia (K22.70) Hx of adenomatous colonic polyps (Z86.010).  The various methods of treatment have been discussed with the patient and family. After consideration of risks, benefits and other options for treatment, the patient has consented to  Procedure(s): COLONOSCOPY (N/A) ESOPHAGOGASTRODUODENOSCOPY (EGD) (N/A) as a surgical intervention.  The patient's history has been reviewed, patient examined, no change in status, stable for surgery.  I have reviewed the patient's chart and labs.  Questions were answered to the patient's satisfaction.     [place], [place]

## 2023-01-06 NOTE — Transfer of Care (Signed)
Immediate Anesthesia Transfer of Care Note  Patient: Zachary Chase  Procedure(s) Performed: COLONOSCOPY ESOPHAGOGASTRODUODENOSCOPY (EGD)  Patient Location: PACU  Anesthesia Type:MAC  Level of Consciousness: awake and sedated  Airway & Oxygen Therapy: Patient Spontanous Breathing and Patient connected to face mask oxygen  Post-op Assessment: Report given to RN and Post -op Vital signs reviewed and stable  Post vital signs: Reviewed and stable  Last Vitals:  Vitals Value Taken Time  BP    Temp    Pulse 79 01/06/23 0900  Resp 23 01/06/23 0900  SpO2 90 % 01/06/23 0900  Vitals shown include unvalidated device data.  Last Pain:  Vitals:   01/06/23 0738  TempSrc: Temporal  PainSc: 0-No pain         Complications: There were no known notable events for this encounter.

## 2023-01-06 NOTE — H&P (Signed)
Outpatient short stay form Pre-procedure 01/06/2023 8:18 AM Savhanna Sliva K. Alice Reichert, M.D.  Primary Physician: Juluis Pitch, M.D.  Reason for visit:  Barrett's esophagus, GERD, Personal history of colon polyps  History of present illness:  77  y/o patient has a personal history of Barrett's esophagus without dysplasia. Patient denies hemetemesis, nausea, vomiting,  weight loss. Takes PPI without significant side effects. He has dysphagia to flank steak and pills.                           Patient presents for colonoscopy for a personal hx of colon polyps. The patient denies abdominal pain, abnormal weight loss or rectal bleeding.       Current Facility-Administered Medications:    0.9 %  sodium chloride infusion, , Intravenous, Continuous, [place], Benay Pike, MD, Last Rate: 20 mL/hr at 01/06/23 0759, New Bag at 01/06/23 0759  Medications Prior to Admission  Medication Sig Dispense Refill Last Dose   fexofenadine (ALLEGRA) 180 MG tablet Take 180 mg by mouth daily.   01/05/2023   finasteride (PROSCAR) 5 MG tablet Take 5 mg by mouth daily.   01/05/2023   glucosamine-chondroitin 500-400 MG tablet Take 2 tablets by mouth daily.   Past Week   lisinopril-hydrochlorothiazide (ZESTORETIC) 20-12.5 MG tablet Take 0.5 tablets by mouth daily.   01/05/2023   pantoprazole (PROTONIX) 40 MG tablet Take 40 mg by mouth daily.   01/05/2023   tamsulosin (FLOMAX) 0.4 MG CAPS capsule Take 0.4 mg by mouth 2 (two) times daily.   01/05/2023   albuterol (PROVENTIL HFA;VENTOLIN HFA) 108 (90 Base) MCG/ACT inhaler Inhale 1 puff into the lungs every 4 (four) hours as needed for wheezing or shortness of breath.      augmented betamethasone dipropionate (DIPROLENE-AF) 0.05 % ointment Apply 1 application topically 2 (two) times daily.      fluticasone (FLONASE) 50 MCG/ACT nasal spray Place 2 sprays into both nostrils daily as needed for allergies or rhinitis.       hydrocortisone cream 1 % Apply 1 application topically 2 (two)  times daily.      Melatonin 10 MG TABS Take 1 tablet by mouth at bedtime.      WIXELA INHUB 250-50 MCG/DOSE AEPB Inhale 1 puff into the lungs 2 (two) times a day.        Allergies  Allergen Reactions   Tetracyclines & Related Nausea And Vomiting     Past Medical History:  Diagnosis Date   Anemia    Arthritis    Asthma    Back pain    BPH (benign prostatic hyperplasia)    Cancer (HCC)    skin   Dermatophytosis of nail    Dyspnea    Enthesopathy of hip region    Gastric ulcer    GERD (gastroesophageal reflux disease)    H/O multiple allergies    History of colonic polyps    Hx of onychia and paronychia    Hypersomnia    Hypertension    Lower extremity edema    Pneumonia    Prostatic hypertrophy    Psoriasis    Pure hypercholesterolemia    Sleep apnea    Testicular hypofunction    Tremor    of the head   Wheezing     Review of systems:  Otherwise negative.    Physical Exam  Gen: Alert, oriented. Appears stated age.  HEENT: [place]/AT. PERRLA. Lungs: CTA, no wheezes. CV: RR nl S1, S2. Abd: soft,  benign, no masses. BS+ Ext: No edema. Pulses 2+    Planned procedures: Proceed with EGD and colonoscopy. The patient understands the nature of the planned procedure, indications, risks, alternatives and potential complications including but not limited to bleeding, infection, perforation, damage to internal organs and possible oversedation/side effects from anesthesia. The patient agrees and gives consent to proceed.  Please refer to procedure notes for findings, recommendations and patient disposition/instructions.     Elmarie Devlin K. Alice Reichert, M.D. Gastroenterology 01/06/2023  8:18 AM

## 2023-01-06 NOTE — Anesthesia Preprocedure Evaluation (Signed)
Anesthesia Evaluation  Patient identified by MRN, date of birth, ID band Patient awake    Reviewed: Allergy & Precautions, NPO status , Patient's Chart, lab work & pertinent test results  History of Anesthesia Complications Negative for: history of anesthetic complications  Airway Mallampati: III  TM Distance: <3 FB Neck ROM: full    Dental  (+) Chipped   Pulmonary shortness of breath and with exertion, asthma , sleep apnea , former smoker   Pulmonary exam normal        Cardiovascular hypertension, (-) angina (-) Past MI Normal cardiovascular exam     Neuro/Psych negative neurological ROS  negative psych ROS   GI/Hepatic Neg liver ROS, PUD,GERD  Controlled,,  Endo/Other  negative endocrine ROSneg diabetes    Renal/GU      Musculoskeletal   Abdominal   Peds  Hematology negative hematology ROS (+)   Anesthesia Other Findings Past Medical History: No date: Anemia No date: Arthritis No date: Asthma No date: Back pain No date: BPH (benign prostatic hyperplasia) No date: Cancer (HCC)     Comment:  skin No date: Dermatophytosis of nail No date: Dyspnea No date: Enthesopathy of hip region No date: Gastric ulcer No date: GERD (gastroesophageal reflux disease) No date: H/O multiple allergies No date: History of colonic polyps No date: Hx of onychia and paronychia No date: Hypersomnia No date: Hypertension No date: Lower extremity edema No date: Pneumonia No date: Prostatic hypertrophy No date: Psoriasis No date: Pure hypercholesterolemia No date: Sleep apnea 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 No date: Tremor     Comment:  of the head No date: Wheezing  Past Surgical History: 09/01/2016: CATARACT EXTRACTION W/PHACO; Right     Comment:  Procedure: CATARACT EXTRACTION PHACO AND INTRAOCULAR               LENS PLACEMENT (IOC);  Surgeon: Birder Robson, MD;                Location: [facility] ORS;  Service:  Ophthalmology;  Laterality:              Right;  Lot# [id number] H [place]: 00:34.0 AP%:21.8 CDE: 7.39  07/13/2019: CATARACT EXTRACTION W/PHACO; Left     Comment:  Procedure: CATARACT EXTRACTION PHACO AND INTRAOCULAR               LENS PLACEMENT ([place]) LEFT;  Surgeon: Birder Robson,               MD;  Location: [facility] ORS;  Service: Ophthalmology;                Laterality: Left;  [place] 00:37 CDE 6.43 Fluid pack lot #               [id number] 05/07/2019: CHOLECYSTECTOMY; N/A     Comment:  Procedure: LAPAROSCOPIC CHOLECYSTECTOMY;  Surgeon:               Jules Husbands, MD;  Location: [facility] ORS;  Service:               General;  Laterality: N/A; 11/30/2018: COLONOSCOPY WITH PROPOFOL; N/A     Comment:  Procedure: COLONOSCOPY WITH PROPOFOL;  Surgeon: Manya Silvas, MD;  Location: [facility] ENDOSCOPY;  Service:               Endoscopy;  Laterality: N/A; 11/30/2018: ESOPHAGOGASTRODUODENOSCOPY (EGD) WITH PROPOFOL; N/A     Comment:  Procedure: ESOPHAGOGASTRODUODENOSCOPY (EGD) WITH  PROPOFOL;  Surgeon: Manya Silvas, MD;  Location:               [facility] ENDOSCOPY;  Service: Endoscopy;  Laterality: N/A; No date: EYE SURGERY No date: sleep apnea surgery No date: TONSILLECTOMY  BMI    Body Mass Index: 36.32 kg/m      Reproductive/Obstetrics negative OB ROS                             Anesthesia Physical Anesthesia Plan  ASA: 3  Anesthesia Plan: General   Post-op Pain Management: Minimal or no pain anticipated   Induction: Intravenous  PONV Risk Score and Plan: 3 and Propofol infusion, TIVA and Ondansetron  Airway Management Planned: Nasal Cannula and Natural Airway  Additional Equipment: None  Intra-op Plan:   Post-operative Plan: Extubation in OR  Informed Consent: I have reviewed the patients History and Physical, chart, labs and discussed the procedure including the risks, benefits and alternatives for the proposed anesthesia with  the patient or authorized representative who has indicated his/her understanding and acceptance.     Dental advisory given  Plan Discussed with: CRNA and Surgeon  Anesthesia Plan Comments: (Discussed risks of anesthesia with patient, including possibility of difficulty with spontaneous ventilation under anesthesia necessitating airway intervention, PONV, and rare risks such as cardiac or respiratory or neurological events, and allergic reactions. Discussed the role of CRNA in patient's perioperative care. Patient understands.)       Anesthesia Quick Evaluation

## 2023-01-06 NOTE — Op Note (Signed)
[facility] Gastroenterology Patient Name: Zachary Chase Procedure Date: 01/06/2023 8:00 AM MRN: [id number] Account #: [id number] Date of Birth: 21-Feb-1946 Admit Type: Outpatient Age: 77 Room: [facility] ENDO ROOM 2 Gender: Male Note Status: Finalized Instrument Name: Upper Endoscope [id number] Procedure:             Upper GI endoscopy Indications:           Surveillance for malignancy due to personal history of                         Barrett's esophagus, Dysphagia, Gastro-esophageal                         reflux disease Providers:             Benay Pike. Alice Reichert MD, MD Referring MD:          Youlanda Roys. Lovie Macadamia, MD (Referring MD) Medicines:             Propofol per Anesthesia Complications:         No immediate complications. Estimated blood loss:                         Minimal. Procedure:             Pre-Anesthesia Assessment:                        - The risks and benefits of the procedure and the                         sedation options and risks were discussed with the                         patient. All questions were answered and informed                         consent was obtained.                        - Patient identification and proposed procedure were                         verified prior to the procedure by the nurse. The                         procedure was verified in the procedure room.                        - ASA Grade Assessment: III - A patient with severe                         systemic disease.                        - After reviewing the risks and benefits, the patient                         was deemed in satisfactory condition to undergo the  procedure.                        After obtaining informed consent, the endoscope was                         passed under direct vision. Throughout the procedure,                         the patient's blood pressure, pulse, and oxygen                         saturations were  monitored continuously. The Endoscope                         was introduced through the mouth, and advanced to the                         third part of duodenum. The upper GI endoscopy was                         somewhat difficult due to enhanced patient gag reflex                         causing intolerance of esophageal intubation.                         Successful completion of the procedure was aided by                         Anesthesia staff assisting with sedation. The patient                         tolerated the procedure well. Findings:      Diffuse, white plaques were found in the upper third of the esophagus.       Cells for cytology were obtained by brushing.      The esophagus and gastroesophageal junction were examined with white       light. There was no visual evidence of Barrett's esophagus. Mucosa was       biopsied with a cold forceps for histology in 4 quadrants at the       gastroesophageal junction. One specimen bottle was sent to pathology.      A non-obstructing Schatzki ring was found in the distal esophagus. The       scope was withdrawn. Dilation was performed with a Maloney dilator with       mild resistance at 38 Fr.      A 1 cm hiatal hernia was present.      The exam of the stomach was otherwise normal.      The examined duodenum was normal.      The exam was otherwise without abnormality. Impression:            - Esophageal plaques were found, consistent with                         candidiasis. Cells for cytology obtained.                        - There  is no endoscopic evidence of Barrett's                         esophagus. Biopsied.                        - Non-obstructing Schatzki ring. Dilated.                        - 1 cm hiatal hernia.                        - Normal examined duodenum.                        - The examination was otherwise normal. Recommendation:        - Await pathology results.                        - Monitor results to  esophageal dilation                        - Proceed with colonoscopy Procedure Code(s):     --- Professional ---                        [phone], Esophagogastroduodenoscopy, flexible,                         transoral; with biopsy, single or multiple                        [id number], Dilation of esophagus, by unguided sound or                         bougie, single or multiple passes Diagnosis Code(s):     --- Professional ---                        K21.9, Gastro-esophageal reflux disease without                         esophagitis                        R13.10, Dysphagia, unspecified                        K44.9, Diaphragmatic hernia without obstruction or                         gangrene                        K22.2, Esophageal obstruction                        K22.70, Barrett's esophagus without dysplasia                        K22.9, Disease of esophagus, unspecified CPT copyright 2022 American Medical Association. All rights reserved. The codes documented in this report are preliminary and upon coder review may  be revised to meet current compliance requirements. Efrain Sella MD, MD 01/06/2023 8:40:54 AM  This report has been signed electronically. Number of Addenda: 0 Note Initiated On: 01/06/2023 8:00 AM Estimated Blood Loss:  Estimated blood loss was minimal.      [facility]

## 2023-01-06 NOTE — Op Note (Signed)
[facility] Gastroenterology Patient Name: Zachary Chase Procedure Date: 01/06/2023 7:59 AM MRN: [id number] Account #: [id number] Date of Birth: [birth year] Admit Type: Outpatient Age: 77 Room: [facility] ENDO ROOM 2 Gender: Male Note Status: Finalized Instrument Name: Park Meo [id number] Procedure:             Colonoscopy Indications:           Surveillance: Personal history of adenomatous polyps                         on last colonoscopy > 5 years ago Providers:             Lorie Apley K. Alice Reichert MD, MD Referring MD:          Youlanda Roys. Lovie Macadamia, MD (Referring MD) Medicines:             Propofol per Anesthesia Complications:         No immediate complications. Procedure:             Pre-Anesthesia Assessment:                        - The risks and benefits of the procedure and the                         sedation options and risks were discussed with the                         patient. All questions were answered and informed                         consent was obtained.                        - Patient identification and proposed procedure were                         verified prior to the procedure by the nurse. The                         procedure was verified in the procedure room.                        - ASA Grade Assessment: III - A patient with severe                         systemic disease.                        - After reviewing the risks and benefits, the patient                         was deemed in satisfactory condition to undergo the                         procedure.                        After obtaining informed consent, the colonoscope was  passed under direct vision. Throughout the procedure,                         the patient's blood pressure, pulse, and oxygen                         saturations were monitored continuously. The                         Colonoscope was introduced through the anus and                          advanced to the the cecum, identified by appendiceal                         orifice and ileocecal valve. The colonoscopy was                         performed without difficulty. The patient tolerated                         the procedure well. The quality of the bowel                         preparation was adequate. The ileocecal valve,                         appendiceal orifice, and rectum were photographed. Findings:      The perianal and digital rectal examinations were normal. Pertinent       negatives include normal sphincter tone and no palpable rectal lesions.      Non-bleeding internal hemorrhoids were found during retroflexion. The       hemorrhoids were Grade II (internal hemorrhoids that prolapse but reduce       spontaneously).      Many small-mouthed diverticula were found in the sigmoid colon.      Two sessile polyps were found in the rectum and transverse colon. The       polyps were 4 to 5 mm in size. These polyps were removed with a jumbo       cold forceps. Resection and retrieval were complete.      The exam was otherwise without abnormality. Impression:            - Non-bleeding internal hemorrhoids.                        - Diverticulosis in the sigmoid colon.                        - Two 4 to 5 mm polyps in the rectum and in the                         transverse colon, removed with a jumbo cold forceps.                         Resected and retrieved.                        - The examination was otherwise normal. Recommendation:        -  Patient has a contact number available for                         emergencies. The signs and symptoms of potential                         delayed complications were discussed with the patient.                         Return to normal activities tomorrow. Written                         discharge instructions were provided to the patient.                        - Await pathology results from EGD, also performed                          today.                        - Monitor results to esophageal dilation                        - Resume previous diet.                        - Continue present medications.                        - If polyps are benign or adenomatous without                         dysplasia, I will advise NO further colonoscopy due to                         advanced age and/or severe comorbidity.                        - If esophageal biopsies do NOT show dysplasia, I will                         recommend NO further esophageal surveillance given                         advanced age and/or significant comorbid medical                         status.                        - Return to GI office PRN.                        - The findings and recommendations were discussed with                         the patient. Procedure Code(s):     --- Professional ---                        [phone],  Colonoscopy, flexible; with biopsy, single or                         multiple Diagnosis Code(s):     --- Professional ---                        K57.30, Diverticulosis of large intestine without                         perforation or abscess without bleeding                        D12.3, Benign neoplasm of transverse colon (hepatic                         flexure or splenic flexure)                        D12.8, Benign neoplasm of rectum                        K64.1, Second degree hemorrhoids                        Z86.010, Personal history of colonic polyps CPT copyright 2022 American Medical Association. All rights reserved. The codes documented in this report are preliminary and upon coder review may  be revised to meet current compliance requirements. Efrain Sella MD, MD 01/06/2023 9:00:39 AM This report has been signed electronically. Number of Addenda: 0 Note Initiated On: 01/06/2023 7:59 AM Scope Withdrawal Time: 0 hours 5 minutes 15 seconds  Total Procedure Duration: 0 hours 12 minutes 7 seconds   Estimated Blood Loss:  Estimated blood loss: none.      [facility]

## 2023-01-07 ENCOUNTER — Encounter: Payer: Self-pay | Admitting: Internal Medicine

## 2023-01-07 LAB — SURGICAL PATHOLOGY

## 2023-01-13 ENCOUNTER — Encounter: Payer: Self-pay | Admitting: Internal Medicine

## 2023-06-30 NOTE — Progress Notes (Unsigned)
07/01/2023 8:41 AM   Zachary Chase 1945/11/25 [id number]  Referring provider: Dorothey Baseman, MD [phone] [address] [place],  [place] [address]  Urological history: 1. Epididymal cyst -scrotal US (10/2021) - Stable 2.1 cm right epididymal cyst  2. Hydrocele -scrotal US (10/2021) - Interval large right hydrocele  3. Varicocele -scrotal US (10/2021) - Possible mild left varicocele  4. BPH w/ LU TS -PSA (07/2021) 0.96 -TURP (10/2022) -pathology benign  -prostate volume 64.6 cc -finasteride 5 mg daily   5. Urinary frequency -contributing factors of age, BPH, incomplete bladder emptying, HTN, sleep apnea, lumber DDD, obesity and former smoker  No chief complaint on file.  HPI: Zachary Chase is a 77 y.o. male who presents today for 6 mnth follow up.   Previous records reviewed.   I PSS ***  PVR ***    Score:  1-7 Mild 8-19 Moderate 20-35 Severe    PMH: Past Medical History:  Diagnosis Date   Anemia    Arthritis    Asthma    Back pain    BPH (benign prostatic hyperplasia)    Cancer (HCC)    skin   Dermatophytosis of nail    Dyspnea    Enthesopathy of hip region    Gastric ulcer    GERD (gastroesophageal reflux disease)    H/O multiple allergies    History of colonic polyps    Hx of onychia and paronychia    Hypersomnia    Hypertension    Lower extremity edema    Pneumonia    Prostatic hypertrophy    Psoriasis    Pure hypercholesterolemia    Sleep apnea    Testicular hypofunction    Tremor    of the head   Wheezing     Surgical History: Past Surgical History:  Procedure Laterality Date   CATARACT EXTRACTION W/PHACO Right 09/01/2016   Procedure: CATARACT EXTRACTION PHACO AND INTRAOCULAR LENS PLACEMENT (IOC);  Surgeon: Galen Manila, MD;  Location: [facility] ORS;  Service: Ophthalmology;  Laterality: Right;  Lot# [id number] H [place]: 00:34.0 AP%:21.8 CDE: 7.39    CATARACT EXTRACTION W/PHACO Left 07/13/2019   Procedure: CATARACT  EXTRACTION PHACO AND INTRAOCULAR LENS PLACEMENT (IOC) LEFT;  Surgeon: Galen Manila, MD;  Location: [facility] ORS;  Service: Ophthalmology;  Laterality: Left;  [place] 00:37 CDE 6.43 Fluid pack lot # [id number]   CHOLECYSTECTOMY N/A 05/07/2019   Procedure: LAPAROSCOPIC CHOLECYSTECTOMY;  Surgeon: Leafy Ro, MD;  Location: [facility] ORS;  Service: General;  Laterality: N/A;   COLONOSCOPY N/A 01/06/2023   Procedure: COLONOSCOPY;  Surgeon: Toledo, Boykin Nearing, MD;  Location: [facility] ENDOSCOPY;  Service: Gastroenterology;  Laterality: N/A;   COLONOSCOPY WITH PROPOFOL N/A 11/30/2018   Procedure: COLONOSCOPY WITH PROPOFOL;  Surgeon: Scot Jun, MD;  [facility] END[facility]COPY;  Service: Endoscopy;  Laterality: N/A;   ESOPHAGOGASTRODUODENOSCOPY N/A 01/06/2023   Procedure: ESOPHAGOGASTRODUODENOSCOPY (EGD);  Surgeon: Toledo, Boykin Nearing, MD;  Location: [facility] ENDOSCOPY;  Service: Gastroenterology;  Laterality: N/A;   ESOPHAGOGASTRODUODENOSCOPY (EGD) WITH PROPOFOL N/A 11/30/2018   Procedure: ESOPHAGOGASTRODUODENOSCOPY (EGD) WITH PROPOFOL;  Surgeon: Scot Jun, MD;  Location: [facility] ENDOSCOPY;  Service: Endoscopy;  Laterality: N/A;   EYE SURGERY     sleep apnea surgery     TONSILLECTOMY     TRANSURETHRAL RESECTION OF PROSTATE N/A 11/16/2022   Procedure: TRANSURETHRAL RESECTION OF THE PROSTATE (TURP);  Surgeon: Vanna Scotland, MD;  Location: [facility] ORS;  Service: Urology;  Laterality: N/A;    Home Medications:  Allergies as of 07/01/2023  Reactions   Tetracyclines & Related Nausea And Vomiting        Medication List        Accurate as of June 30, 2023  8:41 AM. If you have any questions, ask your nurse or doctor.          albuterol 108 (90 Base) MCG/ACT inhaler Commonly known as: VENTOLIN HFA Inhale 1 puff into the lungs every 4 (four) hours as needed for wheezing or shortness of breath.   augmented betamethasone dipropionate 0.05 % ointment Commonly known as: DIPROLENE-AF Apply 1  application topically 2 (two) times daily.   fexofenadine 180 MG tablet Commonly known as: ALLEGRA Take 180 mg by mouth daily.   finasteride 5 MG tablet Commonly known as: PROSCAR Take 5 mg by mouth daily.   fluticasone 50 MCG/ACT nasal spray Commonly known as: FLONASE Place 2 sprays into both nostrils daily as needed for allergies or rhinitis.   glucosamine-chondroitin 500-400 MG tablet Take 2 tablets by mouth daily.   hydrocortisone cream 1 % Apply 1 application topically 2 (two) times daily.   lisinopril-hydrochlorothiazide 20-12.5 MG tablet Commonly known as: ZESTORETIC Take 0.5 tablets by mouth daily.   Melatonin 10 MG Tabs Take 1 tablet by mouth at bedtime.   pantoprazole 40 MG tablet Commonly known as: PROTONIX Take 40 mg by mouth daily.   tamsulosin 0.4 MG Caps capsule Commonly known as: FLOMAX Take 0.4 mg by mouth 2 (two) times daily.   Wixela Inhub 250-50 MCG/DOSE Aepb Generic drug: fluticasone-salmeterol Inhale 1 puff into the lungs 2 (two) times a day.        Allergies:  Allergies  Allergen Reactions   Tetracyclines & Related Nausea And Vomiting    Family History: No family history on file.  Social History:  reports that he has quit smoking. He has never used smokeless tobacco. He reports current alcohol use. He reports that he does not currently use drugs.  ROS: Pertinent ROS in HPI  Physical Exam: There were no vitals taken for this visit.  GU: No CVA tenderness.  No bladder fullness or masses.  Patient with circumcised/uncircumcised phallus. ***Foreskin easily retracted***  Urethral meatus is patent.  No penile discharge. No penile lesions or rashes. Scrotum without lesions, cysts, rashes and/or edema.  Testicles are located scrotally bilaterally. No masses are appreciated in the testicles. Left and right epididymis are normal. Rectal: Patient with  normal sphincter tone. Anus and perineum without scarring or rashes. No rectal masses are  appreciated. Prostate is approximately *** grams, *** nodules are appreciated. Seminal vesicles are normal.   Laboratory Data: CBC w/auto Differential (5 Part) Order: [id number] Component Ref Range & Units 4 mo ago  WBC (White Blood Cell Count) 4.1 - 10.2 10^3/uL 5.4  RBC (Red Blood Cell Count) 4.69 - 6.13 10^6/uL 3.98 Low   Hemoglobin 14.1 - 18.1 gm/dL 78.2 Low   Hematocrit 95.6 - 52.0 % 35.9 Low   MCV (Mean Corpuscular Volume) 80.0 - 100.0 fl 90.2  MCH (Mean Corpuscular Hemoglobin) 27.0 - 31.2 pg 28.9  MCHC (Mean Corpuscular Hemoglobin Concentration) 32.0 - 36.0 gm/dL 21.3  Platelet Count 086 - 450 10^3/uL 248  RDW-CV (Red Cell Distribution Width) 11.6 - 14.8 % 14.2  MPV (Mean Platelet Volume) 9.4 - 12.4 fl 10.9  Neutrophils 1.50 - 7.80 10^3/uL 3.14  Lymphocytes 1.00 - 3.60 10^3/uL 1.31  Monocytes 0.00 - 1.50 10^3/uL 0.43  Eosinophils 0.00 - 0.55 10^3/uL 0.42  Basophils 0.00 - 0.09 10^3/uL 0.08  Neutrophil %  32.0 - 70.0 % 58.2  Lymphocyte % 10.0 - 50.0 % 24.3  Monocyte % 4.0 - 13.0 % 8.0  Eosinophil % 1.0 - 5.0 % 7.8 High   Basophil% 0.0 - 2.0 % 1.5  Immature Granulocyte % <=0.7 % 0.2  Immature Granulocyte Count <=0.06 10^3/L 0.01  Resulting Agency [facility]   Specimen Collected: 02/19/23 11:31   Performed by: [facility] Last Resulted: 02/19/23 15:13  Received From: [facility]  Result Received: 06/30/23 08:41   Comprehensive Metabolic Panel (CMP) Order: [id number] Component Ref Range & Units 4 mo ago  Glucose 70 - 110 mg/dL 409  Sodium 811 - 914 mmol/L 138  Potassium 3.6 - 5.1 mmol/L 4.5  Chloride 97 - 109 mmol/L 104  Carbon Dioxide (CO2) 22.0 - 32.0 mmol/L 28.7  Urea Nitrogen (BUN) 7 - 25 mg/dL 28 High   Creatinine 0.7 - 1.3 mg/dL 1.2  Glomerular Filtration Rate (eGFR) >60 mL/min/1.73sq m 62  Comment: CKD-EPI (2021) does not include patient's race in the calculation of eGFR.  Monitoring  changes of plasma creatinine and eGFR over time is useful for monitoring kidney function.  Interpretive Ranges for eGFR (CKD-EPI 2021):  eGFR:       >60 mL/min/1.73 sq. m - Normal eGFR:       30-59 mL/min/1.73 sq. m - Moderately Decreased eGFR:       15-29 mL/min/1.73 sq. m  - Severely Decreased eGFR:       < 15 mL/min/1.73 sq. m  - Kidney Failure   Note: These eGFR calculations do not apply in acute situations when eGFR is changing rapidly or patients on dialysis.  Calcium 8.7 - 10.3 mg/dL 9.2  AST 8 - 39 U/L 13  ALT 6 - 57 U/L 11  Alk Phos (alkaline Phosphatase) 34 - 104 U/L 86  Albumin 3.5 - 4.8 g/dL 4.0  Bilirubin, Total 0.3 - 1.2 mg/dL 0.4  Protein, Total 6.1 - 7.9 g/dL 6.9  A/G Ratio 1.0 - 5.0 gm/dL 1.4  Resulting Agency [facility]   Specimen Collected: 02/19/23 11:31   Performed by: [facility] Last Resulted: 02/19/23 16:15  Received From: [facility]  Result Received: 06/30/23 08:41   Hemoglobin A1C Order: [id number] Component Ref Range & Units 4 mo ago  Hemoglobin A1C 4.2 - 5.6 % 6.1 High   Average Blood Glucose (Calc) mg/dL 086  Resulting Agency [facility]  Narrative Performed by [facility] Normal Range:    4.2 - 5.6% Increased Risk:  5.7 - 6.4% Diabetes:        >= 6.5% Glycemic Control for adults with diabetes:  <7%    Specimen Collected: 02/19/23 11:31   Performed by: [facility] Last Resulted: 02/19/23 16:49  Received From: [facility]  Result Received: 06/30/23 08:41   Thyroid Stimulating-Hormone (TSH) Order: [id number] Component Ref Range & Units 4 mo ago  Thyroid Stimulating Hormone (TSH) 0.450-5.330 uIU/ml uIU/mL 0.746  Resulting Agency [facility] - LAB   Specimen Collected: 02/19/23 11:31   Performed by: [facility] Last Resulted: 02/19/23 15:41  Received From: [facility]  Result  Received: 06/30/23 08:41  I have reviewed the labs.  See HPI.    Pertinent Imaging: ***  Assessment & Plan:    1. BPH with LUTS -PSA stable *** -DRE benign *** -UA benign *** -  PVR < 300 cc *** -symptoms - *** -most bothersome symptoms are *** -continue conservative management, avoiding bladder irritants and timed voiding's -Initiate alpha-blocker (***), discussed side effects *** -Initiate 5 alpha reductase inhibitor (***), discussed side effects *** -Continue tamsulosin 0.4 mg daily, alfuzosin 10 mg daily, Rapaflo 8 mg daily, terazosin, doxazosin, Cialis 5 mg daily and finasteride 5 mg daily, dutasteride 0.5 mg daily***:refills given -Cannot tolerate medication or medication failure, schedule cystoscopy ***   2. OAB -Reassess at 6-week follow-up  No follow-ups on file.  These notes generated with voice recognition software. I apologize for typographical errors.  Cloretta Ned  [facility] [address]  [address] [place], [place] [address] [phone]

## 2023-07-01 ENCOUNTER — Ambulatory Visit (INDEPENDENT_AMBULATORY_CARE_PROVIDER_SITE_OTHER): Payer: Medicare Other | Admitting: Urology

## 2023-07-01 ENCOUNTER — Encounter: Payer: Self-pay | Admitting: Urology

## 2023-07-01 VITALS — BP 138/78 | HR 60 | Ht 63.0 in | Wt 214.3 lb

## 2023-07-01 DIAGNOSIS — N401 Enlarged prostate with lower urinary tract symptoms: Secondary | ICD-10-CM | POA: Diagnosis not present

## 2023-07-01 DIAGNOSIS — N3281 Overactive bladder: Secondary | ICD-10-CM

## 2023-07-01 DIAGNOSIS — N138 Other obstructive and reflux uropathy: Secondary | ICD-10-CM

## 2023-07-01 LAB — BLADDER SCAN AMB NON-IMAGING

## 2023-09-26 IMAGING — US US SCROTUM W/ DOPPLER COMPLETE
1 series · 14 of 25 positions shown · non-contrast
Comparison: 04/30/2014

CLINICAL DATA: Testicular mass for the past 6 weeks, laterality not
specified.

EXAM:
SCROTAL ULTRASOUND
DOPPLER ULTRASOUND OF THE TESTICLES
TECHNIQUE: Complete ultrasound examination of the testicles, epididymis, and
other scrotal structures was performed. Color and spectral Doppler
ultrasound were also utilized to evaluate blood flow to the
testicles.

[Series 1: testis us · 14 of 101 slices shown]
[im 1/101]
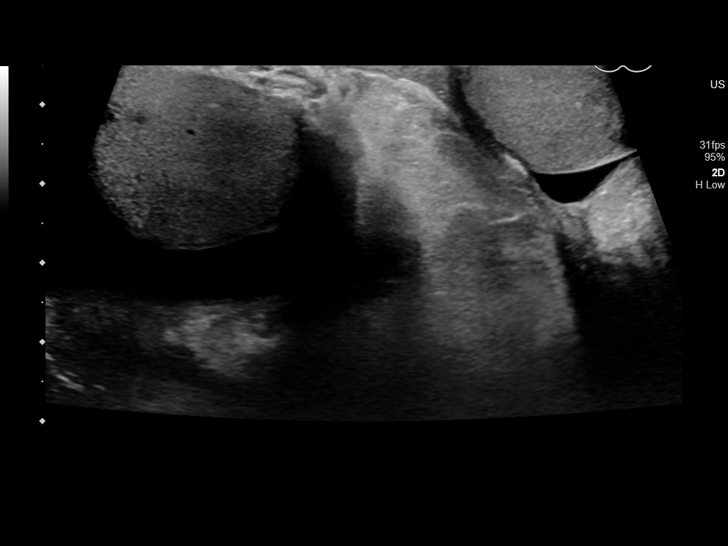
[im 9/101]
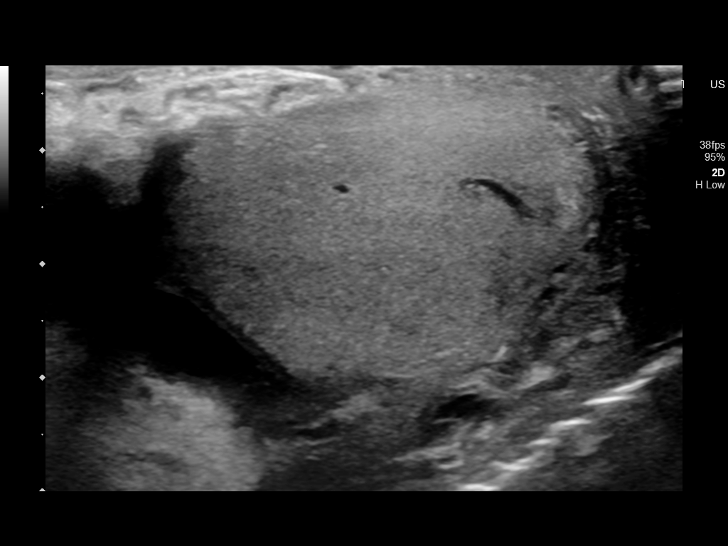
[im 17/101]
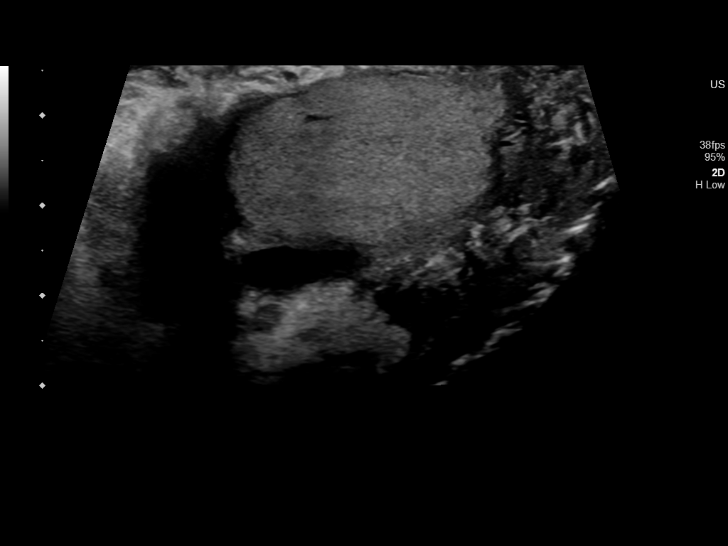
[im 26/101]
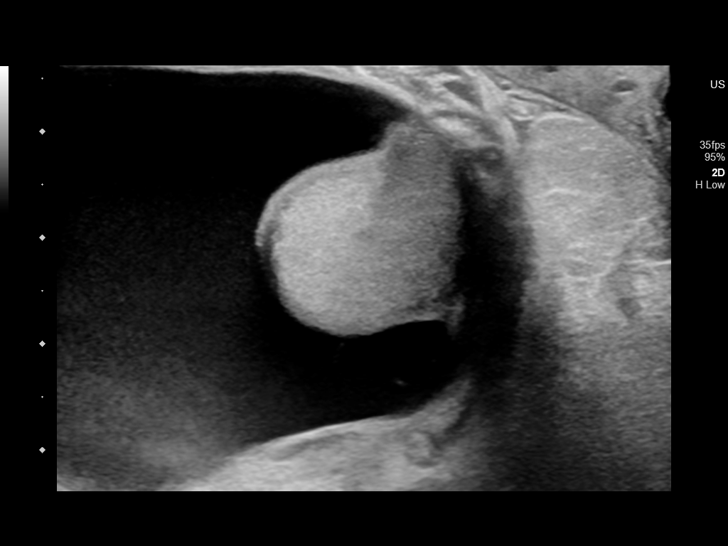
[im 34/101]
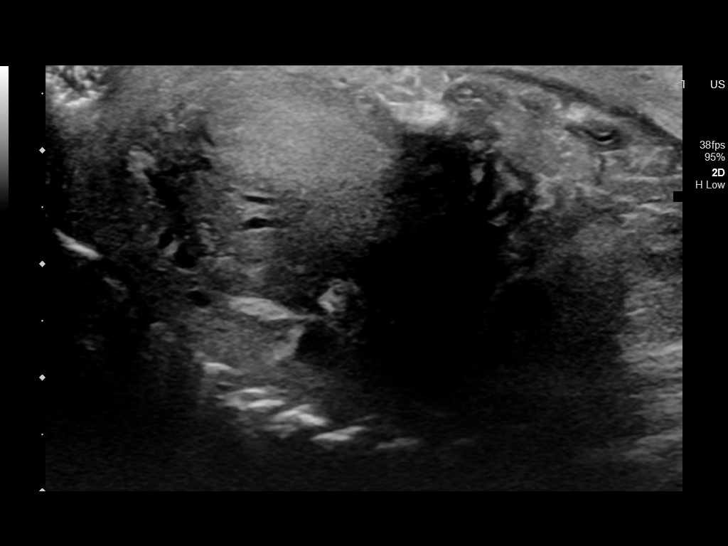
[im 38/101]
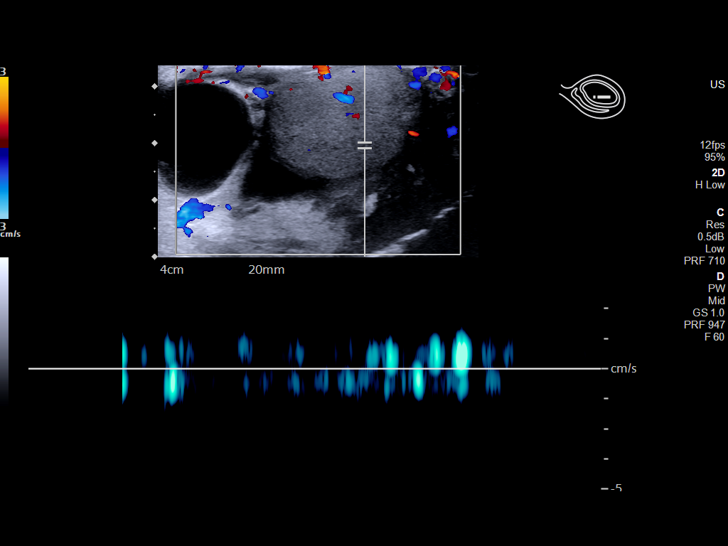
[im 46/101]
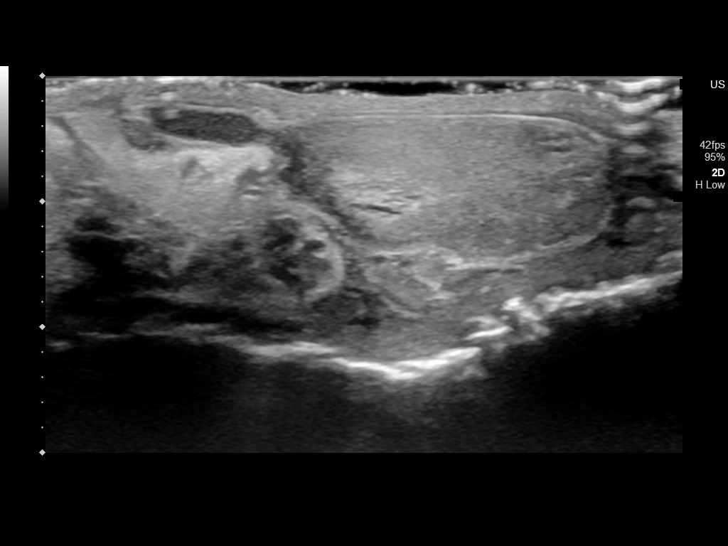
[im 55/101]
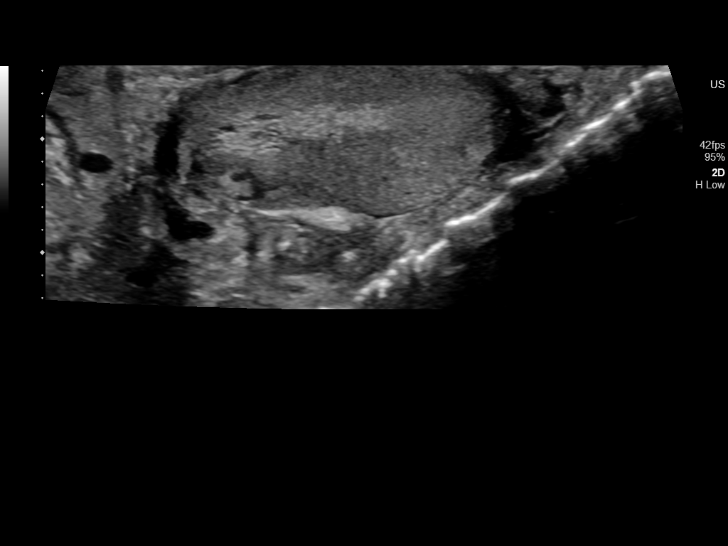
[im 63/101]
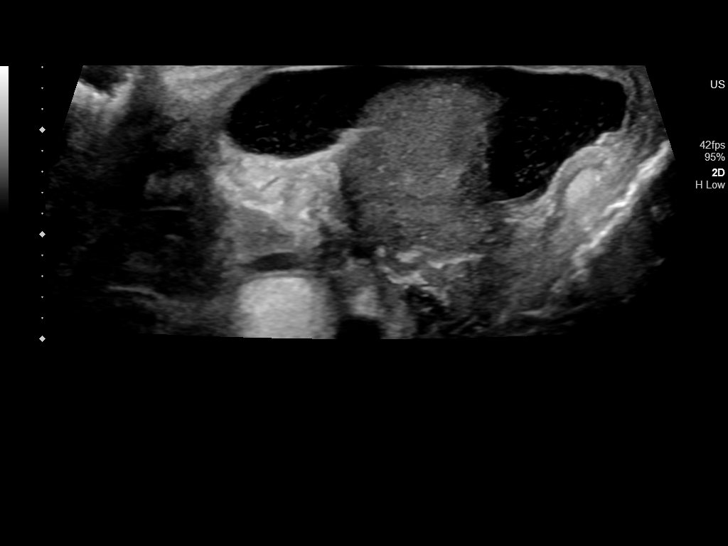
[im 67/101]
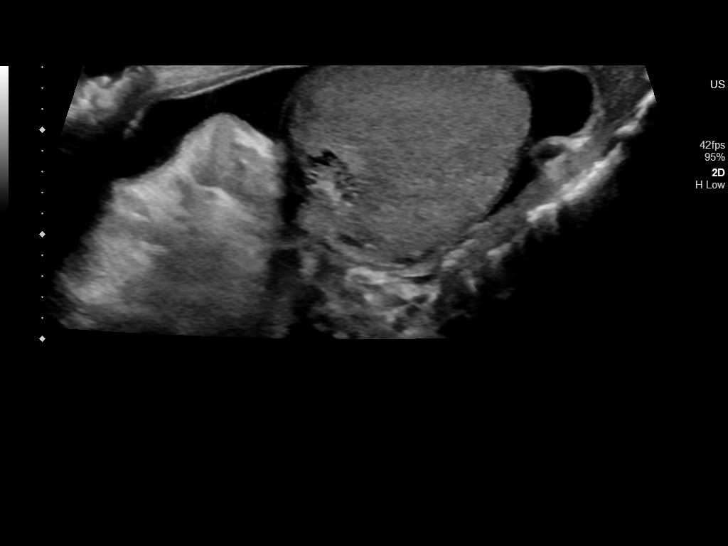
[im 76/101]
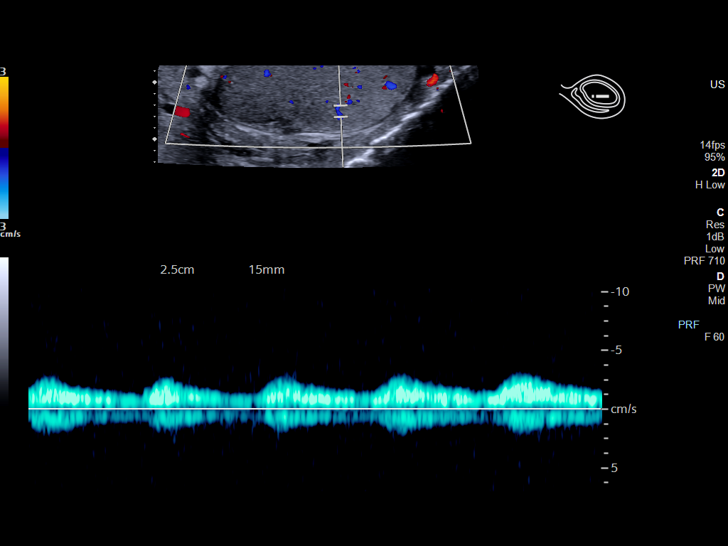
[im 84/101]
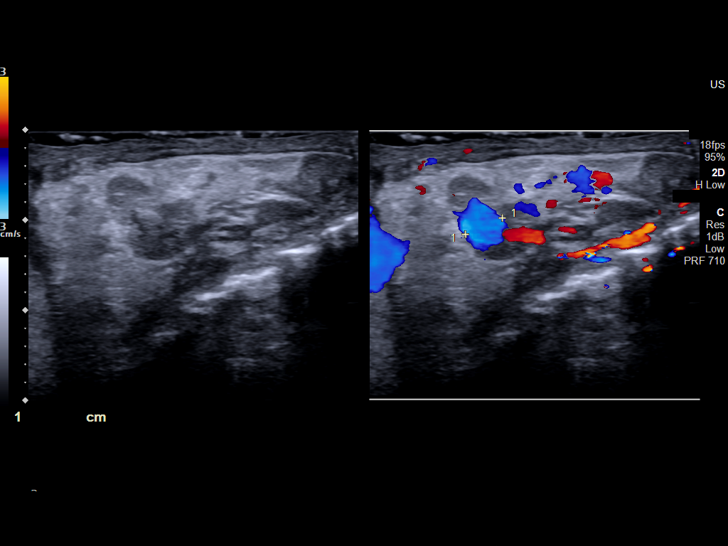
[im 92/101]
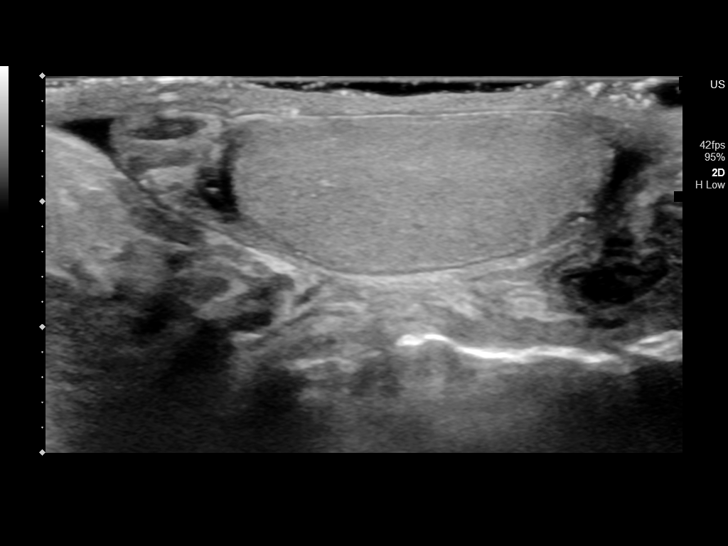
[im 101/101]
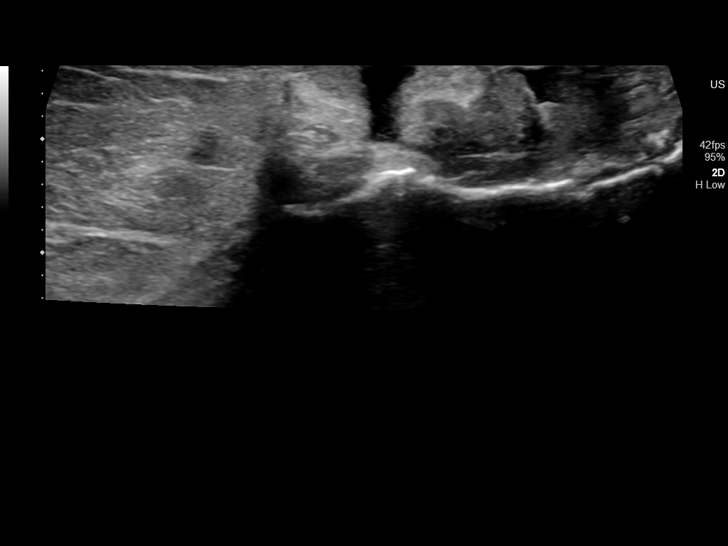

[14 of 25 positions shown; findings below may reference images not displayed]

FINDINGS: Right testicle

Measurements: 3.8 x 2.5 x 1.9 cm. No mass or microlithiasis
visualized.

Left testicle

Measurements: 3.8 x 2.2 x 1.6 cm. No mass or microlithiasis
visualized.

Right epididymis: Again demonstrated is a large epididymal cyst
measuring 2.1 x 2.1 x 1.5 cm without significant change.

Left epididymis: Mildly heterogeneous without mass or enlargement.
Normal internal blood flow.

Hydrocele:  Interval large right hydrocele.

Varicocele:  Possible mild left varicocele.

Pulsed Doppler interrogation of both testes demonstrates normal low
resistance arterial and venous waveforms bilaterally.
IMPRESSION: 1. Stable 2.1 cm right epididymal cyst.
2. Interval large right hydrocele.
3. Possible mild left varicocele.

## 2024-06-29 NOTE — Progress Notes (Signed)
 06/30/2024 3:31 PM   Garen Nadara Pouch 06/08/1946 [id number]  Referring provider: Glover Lenis, MD [phone] [address] [place],  [place] [address]  Urological history: 1. Epididymal cyst -scrotal US  (10/2021) - Stable 2.1 cm right epididymal cyst  2. Hydrocele -scrotal US  (10/2021) - Interval large right hydrocele  3. Varicocele -scrotal US  (10/2021) - Possible mild left varicocele  4. BPH w/ LU TS -PSA (07/2021) 0.96 -TURP (10/2022) -pathology benign  -prostate volume 64.6 cc -finasteride  5 mg daily   5. Urinary frequency -contributing factors of age, BPH, incomplete bladder emptying, HTN, sleep apnea, lumber DDD, obesity and former smoker  Chief Complaint  Patient presents with   Benign Prostatic Hypertrophy   HPI: Fernado Jamaar Howes is a 78 y.o. male who presents today for one year follow up.   Previous records reviewed.   He reports urinary frequency, urinary intermittency, urinary urgency, nocturia x 2, leaking before being able to reach the restroom, leaking without awareness, and post void dribbling.     He is wearing 2 pads//depends  daily.    Patient denies any modifying or aggravating factors.  Patient denies any recent UTI's, gross hematuria, dysuria or suprapubic/flank pain.  Patient denies any fevers, chills, nausea or vomiting.    UA (07/2023) benign  Serum creatinine (01/2024) 1.1, eGFR 69  Hemoglobin A1c (01/2024) 6.0  Diuretics: Lisinopril /hydrochlorothiazide  20/12.5  He is taking finasteride  5 mg daily.    PMH: Past Medical History:  Diagnosis Date   Anemia    Arthritis    Asthma    Back pain    BPH (benign prostatic hyperplasia)    Cancer (HCC)    skin   Dermatophytosis of nail    Dyspnea    Enthesopathy of hip region    Gastric ulcer    GERD (gastroesophageal reflux disease)    H/O multiple allergies    History of colonic polyps    Hx of onychia and paronychia    Hypersomnia    Hypertension    Lower extremity edema     Pneumonia    Prostatic hypertrophy    Psoriasis    Pure hypercholesterolemia    Sleep apnea    Testicular hypofunction    Tremor    of the head   Wheezing     Surgical History: Past Surgical History:  Procedure Laterality Date   CATARACT EXTRACTION W/PHACO Right 09/01/2016   Procedure: CATARACT EXTRACTION PHACO AND INTRAOCULAR LENS PLACEMENT (IOC);  Surgeon: Elsie Carmine, MD;  Location: [facility] ORS;  Service: Ophthalmology;  Laterality: Right;  Lot# [id number] US : 00:34.0 AP%:21.8 CDE: 7.39    CATARACT EXTRACTION W/PHACO Left 07/13/2019   Procedure: CATARACT EXTRACTION PHACO AND INTRAOCULAR LENS PLACEMENT (IOC) LEFT;  Surgeon: Carmine Elsie, MD;  Location: [facility] ORS;  Service: Ophthalmology;  Laterality: Left;  US  00:37 CDE 6.43 Fluid pack lot # [id number]   CHOLECYSTECTOMY N/A 05/07/2019   Procedure: LAPAROSCOPIC CHOLECYSTECTOMY;  Surgeon: Jordis Laneta FALCON, MD;  Location: [facility] ORS;  Service: General;  Laterality: N/A;   COLONOSCOPY N/A 01/06/2023   Procedure: COLONOSCOPY;  Surgeon: Toledo, Ladell POUR, MD;  Location: [facility] ENDOSCOPY;  Service: Gastroenterology;  Laterality: N/A;   COLONOSCOPY WITH PROPOFOL  N/A 11/30/2018   Procedure: COLONOSCOPY WITH PROPOFOL ;  Surgeon: Viktoria Lamar DASEN, MD;  Location[facility] ENDOSCOPY;  Service: Endoscopy;  Laterality: N/A;   ESOPHAGOGASTRODUODENOSCOPY N/A 01/06/2023   Procedure: ESOPHAGOGASTRODUODENOSCOPY (EGD);  Surgeon: Toledo, Ladell POUR, MD;  Location: [facility] ENDOSCOPY;  Service: Gastroenterology;  Laterality: N/A;   ESOPHAGOGASTRODUODENOSCOPY (EGD)  WITH PROPOFOL  N/A 11/30/2018   Procedure: ESOPHAGOGASTRODUODENOSCOPY (EGD) WITH PROPOFOL ;  Surgeon: Viktoria Lamar DASEN, MD;  Location: [facility] END[facility]COPY;  Service: Endoscopy;  Laterality: N/A;   EYE SURGERY     sleep apnea surgery     TONSILLECTOMY     TRANSURETHRAL RESECTION OF PROSTATE N/A 11/16/2022   Procedure: TRANSURETHRAL RESECTION OF THE PROSTATE (TURP);  Surgeon: Penne Knee, MD;  Location: [facility]  ORS;  Service: Urology;  Laterality: N/A;    Home Medications:  Allergies as of 06/30/2024       Reactions   Tetracyclines & Related Nausea And Vomiting        Medication List        Accurate as of June 30, 2024 11:59 PM. If you have any questions, ask your nurse or doctor.          albuterol  108 (90 Base) MCG/ACT inhaler Commonly known as: VENTOLIN  HFA Inhale 1 puff into the lungs every 4 (four) hours as needed for wheezing or shortness of breath.   augmented betamethasone dipropionate 0.05 % ointment Commonly known as: DIPROLENE-AF Apply 1 application topically 2 (two) times daily.   clobetasol ointment 0.05 % Commonly known as: TEMOVATE Apply topically 2 (two) times daily as needed.   fexofenadine 180 MG tablet Commonly known as: ALLEGRA Take 180 mg by mouth daily.   finasteride  5 MG tablet Commonly known as: PROSCAR  Take 1 tablet (5 mg total) by mouth daily.   fluticasone  50 MCG/ACT nasal spray Commonly known as: FLONASE  Place 2 sprays into both nostrils daily as needed for allergies or rhinitis.   glucosamine-chondroitin 500-400 MG tablet Take 2 tablets by mouth daily.   hydrocortisone cream 1 % Apply 1 application topically 2 (two) times daily.   lisinopril -hydrochlorothiazide  20-12.5 MG tablet Commonly known as: ZESTORETIC  Take 0.5 tablets by mouth daily.   Melatonin 10 MG Tabs Take 1 tablet by mouth at bedtime.   pantoprazole  40 MG tablet Commonly known as: PROTONIX  Take 40 mg by mouth daily.   tamsulosin  0.4 MG Caps capsule Commonly known as: FLOMAX  Take 0.4 mg by mouth 2 (two) times daily.   Wixela Inhub 250-50 MCG/DOSE Aepb Generic drug: fluticasone -salmeterol Inhale 1 puff into the lungs 2 (two) times a day.        Allergies:  Allergies  Allergen Reactions   Tetracyclines & Related Nausea And Vomiting    Family History: No family history on file.  Social History:  reports that he has quit smoking. He has never used  smokeless tobacco. He reports current alcohol use. He reports that he does not currently use drugs.  ROS: Pertinent ROS in HPI  Physical Exam: Blood pressure (!) 142/79, pulse 78, weight 220 lb (99.8 kg), SpO2 96%.  Constitutional:  Well nourished. Alert and oriented, No acute distress. HEENT: [place] AT, moist mucus membranes.  Trachea midline Cardiovascular: No clubbing, cyanosis, or edema. Respiratory: Normal respiratory effort, no increased work of breathing. Neurologic: Grossly intact, no focal deficits, moving all 4 extremities. Psychiatric: Normal mood and affect.     Laboratory Data: Comprehensive Metabolic Panel (CMP) Order: [id number] Component Ref Range & Units 4 mo ago  Glucose 70 - 110 mg/dL 896  Sodium 863 - 854 mmol/L 140  Potassium 3.6 - 5.1 mmol/L 4.6  Chloride 97 - 109 mmol/L 106  Carbon Dioxide (CO2) 22.0 - 32.0 mmol/L 29.7  Urea Nitrogen (BUN) 7 - 25 mg/dL 25  Creatinine 0.7 - 1.3 mg/dL 1.1  Glomerular Filtration Rate (eGFR) >60 mL/min/1.73sq m 69  Comment: CKD-EPI (2021) does not include patient's race in the calculation of eGFR.  Monitoring changes of plasma creatinine and eGFR over time is useful for monitoring kidney function.  Interpretive Ranges for eGFR (CKD-EPI 2021):  eGFR:       >60 mL/min/1.73 sq. m - Normal eGFR:       30-59 mL/min/1.73 sq. m - Moderately Decreased eGFR:       15-29 mL/min/1.73 sq. m  - Severely Decreased eGFR:       < 15 mL/min/1.73 sq. m  - Kidney Failure   Note: These eGFR calculations do not apply in acute situations when eGFR is changing rapidly or patients on dialysis.  Calcium 8.7 - 10.3 mg/dL 8.8  AST 8 - 39 U/L 15  ALT 6 - 57 U/L 14  Alk Phos (alkaline Phosphatase) 34 - 104 U/L 86  Albumin 3.5 - 4.8 g/dL 4.1  Bilirubin, Total 0.3 - 1.2 mg/dL 0.4  Protein, Total 6.1 - 7.9 g/dL 6.9  A/G Ratio 1.0 - 5.0 gm/dL 1.5  Resulting Agency [facility]   Specimen Collected: 02/22/24 12:08    Performed by: [facility] Last Resulted: 02/22/24 16:20  Received From: [facility]  Result Received: 06/29/24 20:20   Hemoglobin A1C Order: [id number] Component Ref Range & Units 4 mo ago  Hemoglobin A1C 4.2 - 5.6 % 6.0 High   Average Blood Glucose (Calc) mg/dL 873  Resulting Agency [facility]  Narrative Performed by [facility] Normal Range:    4.2 - 5.6% Increased Risk:  5.7 - 6.4% Diabetes:        >= 6.5% Glycemic Control for adults with diabetes:  <7%    Specimen Collected: 02/22/24 12:08   Performed by: [facility] Last Resulted: 02/22/24 17:04  Received From: [facility]  Result Received: 06/29/24 20:20  I have reviewed the labs.  See HPI.    Pertinent Imaging: N/A  Assessment & Plan:    1. BPH with LUTS -continue conservative management, avoiding bladder irritants and timed voiding's  -Continue finasteride  5 mg daily    2. Incontinence -Minimal bother even though he has to wear pads -he does not desire any further evaluation or management with this issue  Return in about 1 year (around 06/30/2025) for medication management .  These notes generated with voice recognition software. I apologize for typographical errors.  CLOTILDA HELON RIGGERS  [facility] [address]  [address] [place], [place] [address] [phone]

## 2024-06-30 ENCOUNTER — Ambulatory Visit (INDEPENDENT_AMBULATORY_CARE_PROVIDER_SITE_OTHER): Payer: Self-pay | Admitting: Urology

## 2024-06-30 VITALS — BP 142/79 | HR 78 | Wt 220.0 lb

## 2024-06-30 DIAGNOSIS — N3941 Urge incontinence: Secondary | ICD-10-CM | POA: Diagnosis not present

## 2024-06-30 DIAGNOSIS — N3281 Overactive bladder: Secondary | ICD-10-CM

## 2024-06-30 DIAGNOSIS — N401 Enlarged prostate with lower urinary tract symptoms: Secondary | ICD-10-CM | POA: Diagnosis not present

## 2024-06-30 DIAGNOSIS — N138 Other obstructive and reflux uropathy: Secondary | ICD-10-CM

## 2024-06-30 MED ORDER — FINASTERIDE 5 MG PO TABS: 5.0000 mg | ORAL_TABLET | Freq: Every day | ORAL | 0 refills | Status: AC

## 2024-07-02 ENCOUNTER — Encounter: Payer: Self-pay | Admitting: Urology

## 2025-07-03 ENCOUNTER — Ambulatory Visit: Admitting: Urology
